# Patient Record
Sex: Male | Born: 2000 | Race: White | Hispanic: No | Marital: Single | Smoking: Current every day smoker
Health system: Southern US, Community
[De-identification: ages and names within clinical notes are randomized; demographics above are authoritative.]

## PROBLEM LIST (undated history)

## (undated) DIAGNOSIS — F329 Major depressive disorder, single episode, unspecified: Secondary | ICD-10-CM

## (undated) DIAGNOSIS — S40811A Abrasion of right upper arm, initial encounter: Secondary | ICD-10-CM

## (undated) DIAGNOSIS — F319 Bipolar disorder, unspecified: Secondary | ICD-10-CM

## (undated) DIAGNOSIS — S022XXA Fracture of nasal bones, initial encounter for closed fracture: Secondary | ICD-10-CM

## (undated) DIAGNOSIS — F32A Depression, unspecified: Secondary | ICD-10-CM

## (undated) DIAGNOSIS — E109 Type 1 diabetes mellitus without complications: Secondary | ICD-10-CM

## (undated) DIAGNOSIS — R569 Unspecified convulsions: Secondary | ICD-10-CM

## (undated) HISTORY — DX: Type 1 diabetes mellitus without complications: E10.9

## (undated) HISTORY — PX: APPENDECTOMY: SHX54

---
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
## 2018-02-16 ENCOUNTER — Emergency Department (HOSPITAL_COMMUNITY)
Admission: EM | Admit: 2018-02-16 | Discharge: 2018-02-16 | Disposition: A | Discharge status: Home / Self Care | Payer: Medicaid Other | Attending: Emergency Medicine | Admitting: Emergency Medicine

## 2018-02-16 ENCOUNTER — Encounter (HOSPITAL_COMMUNITY): Payer: Self-pay | Admitting: Emergency Medicine

## 2018-02-16 DIAGNOSIS — R739 Hyperglycemia, unspecified: Secondary | ICD-10-CM

## 2018-02-16 DIAGNOSIS — E1065 Type 1 diabetes mellitus with hyperglycemia: Secondary | ICD-10-CM | POA: Insufficient documentation

## 2018-02-16 DIAGNOSIS — F17210 Nicotine dependence, cigarettes, uncomplicated: Secondary | ICD-10-CM | POA: Insufficient documentation

## 2018-02-16 DIAGNOSIS — Z794 Long term (current) use of insulin: Secondary | ICD-10-CM | POA: Insufficient documentation

## 2018-02-16 DIAGNOSIS — IMO0001 Reserved for inherently not codable concepts without codable children: Secondary | ICD-10-CM

## 2018-02-16 LAB — URINALYSIS, ROUTINE W REFLEX MICROSCOPIC
BILIRUBIN URINE: NEGATIVE
Bacteria, UA: NONE SEEN
Glucose, UA: >=500 mg/dL — AB
HGB URINE DIPSTICK: NEGATIVE
Ketones, ur: 20 mg/dL — AB
Leukocytes, UA: NEGATIVE
Nitrite: NEGATIVE
PH: 5 (ref 5.0–8.0)
Protein, ur: NEGATIVE mg/dL
SPECIFIC GRAVITY, URINE: 1.025 (ref 1.005–1.030)

## 2018-02-16 LAB — BASIC METABOLIC PANEL
ANION GAP: 11 (ref 5–15)
BUN: 19 mg/dL (ref 6–20)
CO2: 25 mmol/L (ref 22–32)
Calcium: 9.2 mg/dL (ref 8.9–10.3)
Chloride: 92 mmol/L — ABNORMAL LOW (ref 98–111)
Creatinine, Ser: 1.03 mg/dL (ref 0.61–1.24)
GFR calc Af Amer: >60 mL/min (ref >60)
GLUCOSE: 775 mg/dL — AB (ref 70–99)
Potassium: 4.9 mmol/L (ref 3.5–5.1)
SODIUM: 128 mmol/L — AB (ref 135–145)

## 2018-02-16 LAB — CBC
HCT: 43.1 % (ref 39.0–52.0)
Hemoglobin: 14 g/dL (ref 13.0–17.0)
MCH: 28 pg (ref 26.0–34.0)
MCHC: 32.5 g/dL (ref 30.0–36.0)
MCV: 86.2 fL (ref 80.0–100.0)
PLATELETS: 267 10*3/uL (ref 150–400)
RBC: 5 MIL/uL (ref 4.22–5.81)
RDW: 12.5 % (ref 11.5–15.5)
WBC: 6.2 10*3/uL (ref 4.0–10.5)
nRBC: 0 % (ref 0.0–0.2)

## 2018-02-16 LAB — CBG MONITORING, ED
Glucose-Capillary: >600 mg/dL (ref 70–99)
Glucose-Capillary: >600 mg/dL (ref 70–99)
Glucose-Capillary: 417 mg/dL — ABNORMAL HIGH (ref 70–99)
Glucose-Capillary: 341 mg/dL — ABNORMAL HIGH (ref 70–99)

## 2018-02-16 MED ORDER — INSULIN PEN NEEDLE 32G X 4 MM MISC: 4 refills | Status: DC

## 2018-02-16 MED ORDER — INSULIN REGULAR(HUMAN) IN NACL 100-0.9 UT/100ML-% IV SOLN
INTRAVENOUS | Status: DC
  Administered 2018-02-16: 100 [IU] via INTRAVENOUS
  Filled 2018-02-16: qty 100

## 2018-02-16 MED ORDER — SODIUM CHLORIDE 0.9 % IV BOLUS
2000.0000 mL | Freq: Once | INTRAVENOUS | Status: AC
  Administered 2018-02-16: 2000 mL via INTRAVENOUS

## 2018-02-16 MED ORDER — INSULIN GLARGINE 100 UNIT/ML SOLOSTAR PEN: 25.0000 [IU] | PEN_INJECTOR | Freq: Every day | SUBCUTANEOUS | 3 refills | Status: DC

## 2018-02-16 MED ORDER — DEXTROSE 50 % IV SOLN: 25.0000 mL | INTRAVENOUS | Status: DC | PRN

## 2018-02-16 MED ORDER — INSULIN REGULAR BOLUS VIA INFUSION
0.0000 [IU] | Freq: Three times a day (TID) | INTRAVENOUS | Status: DC
  Filled 2018-02-16: qty 10

## 2018-02-16 MED ORDER — INSULIN ASPART 100 UNIT/ML FLEXPEN: PEN_INJECTOR | SUBCUTANEOUS | 3 refills | Status: DC

## 2018-02-16 MED ORDER — DEXTROSE-NACL 5-0.45 % IV SOLN: INTRAVENOUS | Status: DC

## 2018-02-16 MED ORDER — SODIUM CHLORIDE 0.9 % IV SOLN
INTRAVENOUS | Status: DC
  Administered 2018-02-16: 14:00:00 via INTRAVENOUS

## 2018-02-16 NOTE — ED Notes (Signed)
Pt requesting d/c. MD aware.

## 2018-02-16 NOTE — ED Provider Notes (Addendum)
[facility] DEPT Provider Note   CSN: [id number] Arrival date & time: 02/16/18  1245     History   Chief Complaint Chief Complaint  Patient presents with  . Hyperglycemia    HPI Rodney [name] is a 17 y.o. male.  HPI 17 year old type I diabetic presents the emergency department with hyperglycemia.  He reports that he did not take his NovoLog this morning.  He is otherwise compliant with his medications.  He still has his insulin at home.  He presents feeling somewhat lightheaded while at work without associated syncope.  No preceding chest pain or palpitations.  Feels better after IV fluids at this time.  Found to be hyperglycemic on arrival to the emergency department.  Initially had some nausea without vomiting.  Denies nausea at this time.  Denies headache.   Past Medical History:  Diagnosis Date  . Abrasion of arm, right 08/22/2011  . Bipolar 1 disorder ([place])   . Depression   . Diabetes mellitus    Type I - insulin pump  . Diabetes mellitus type I ([place])   . Nasal fracture 08/13/2011   was hit in nose while playing basketball  . Seizures ([place])    x 3 - due to low blood sugar - last time 5-6 yrs. ago    Patient Active Problem List   Diagnosis Date Noted  . 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 GERD (gastroesophageal reflux disease) 09/15/2015  . Unintentional weight loss 09/15/2015  . Injury of right hand 07/28/2015  . Diabetic ketoacidosis without coma associated with type 1 diabetes mellitus ([place])   . Ketonuria 04/17/2014  . Poorly controlled type 1 diabetes mellitus ([place]) 04/17/2014  . Hyperglycemia due to type 1 diabetes mellitus ([place]) 03/18/2014  . Diabetic ketoacidosis ([place]) 05/20/2013  . Dehydration 05/06/2013  . Non compliance with medical treatment 05/06/2013  . DKA (diabetic ketoacidoses) ([place]) 05/05/2013  . Diabetes ([place]) 03/25/2013  . Mild Diabetic ketoacidosis  03/25/2013  . Nausea with vomiting 03/25/2013  . Adjustment reaction 12/05/2012  . DKA (diabetic ketoacidosis) ([place]) 12/04/2012  . DKA, type 1 ([place]) 12/04/2012  . Non compliance w medication regimen 11/21/2012  . 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[place]) 08/02/2012  . 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 Fracture, nasal 08/24/2011    Class: Acute  . Goiter   . Hypoglycemia associated with diabetes ([place]) 05/23/2011  . 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 Diabetic autonomic neuropathy ([place]) 05/23/2011  . Tachycardia 05/23/2011  . Type I (juvenile type) diabetes mellitus without mention of complication, uncontrolled 08/30/2010  . 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 APPENDECTOMY    . CLOSED REDUCTION NASAL FRACTURE  08/24/2011   Procedure: CLOSED REDUCTION NASAL FRACTURE;  Surgeon: [name] Belfast, MD;  Location: [facility];  Service: ENT;  Laterality: N/A;        Home Medications    Prior to Admission medications   Medication Sig Start Date End Date Taking? Authorizing Provider  DEPAKOTE 250 MG DR tablet Take two tablets each evening. Patient taking differently: Take 500 mg by mouth every evening.  09/10/15 09/09/16  Sherrlyn Hock, MD  glucagon (GLUCAGON EMERGENCY) 1 MG injection Inject 1 mg into the vein once as needed (for hypoglycemia). 04/19/14   Guerry Minors, MD  insulin aspart (NOVOLOG FLEXPEN) 100 UNIT/ML FlexPen By two-component method at meals, bedtime  and 2 Am. Up to 50 units per day. 09/23/16 12/13/17  Sherrlyn Hock, MD  insulin aspart (NOVOLOG FLEXPEN) 100 UNIT/ML FlexPen Sliding Scale Inject into the skin 3 (three) times daily with meals. Sliding Scale 02/16/18   Jola Schmidt, MD  insulin aspart protamine- aspart (NOVOLOG MIX 70/30) (70-30) 100 UNIT/ML injection Inject 15 mLs (1,500 Units total) into the skin 2 (two)  times daily with a meal. Issue one 5-pack of pens per month. Take 30 units at the first meal of the day and 15 units at dinner. Patient not taking: Reported on 09/23/2016 09/13/16 09/13/17  Long, Wonda Olds, MD  Insulin Glargine (LANTUS SOLOSTAR) 100 UNIT/ML Solostar Pen Inject 25 Units into the skin at bedtime. 02/16/18   Jola Schmidt, MD  Insulin Pen Needle (BD PEN NEEDLE NANO U/F) 32G X 4 MM MISC INJECT INSULIN VIA INSULIN PEN 7 X DAILY 02/16/18   Jola Schmidt, MD  NOVOLOG FLEXPEN 100 UNIT/ML FlexPen Inject at meals and bedtime, up to 50 units per day. 09/23/16 09/23/17  Sherrlyn Hock, MD    Family History Family History  Problem Relation Age of Onset  . Heart disease Mother        MI at age 63  . Diabetes Paternal Grandfather        type 2  . Hypertension Paternal Grandfather   . Heart disease Paternal Grandfather   . Hepatitis C Brother        older brother (incarcerated)    Social History Social History   Tobacco Use  . Smoking status: Current Every Day Smoker    Types: Cigarettes  . Smokeless tobacco: Never Used  Substance Use Topics  . Alcohol use: No  . Drug use: No     Allergies   Patient has no known allergies.   Review of Systems Review of Systems  All other systems reviewed and are negative.    Physical Exam Updated Vital Signs BP 124/74   Pulse 65   Temp (!) 97.5 F (36.4 C) (Oral)   Resp 15   Ht 5\' 6"  (1.676 m)   Wt 63 kg   SpO2 99%   BMI 22.44 kg/m   Physical Exam  Constitutional: He is oriented to person, place, and time. He appears well-developed and well-nourished.  HENT:  Head: Normocephalic and atraumatic.  Eyes: EOM are normal.  Neck: Normal range of motion.  Cardiovascular: Normal rate, regular rhythm, normal heart sounds and intact distal pulses.  Pulmonary/Chest: Effort normal and breath sounds normal. No respiratory distress.  Abdominal: Soft. He exhibits no distension. There is no tenderness.  Musculoskeletal: Normal range of  motion.  Neurological: He is alert and oriented to person, place, and time.  Skin: Skin is warm and dry.  Psychiatric: He has a normal mood and affect. Judgment normal.  Nursing note and vitals reviewed.    ED Treatments / Results  Labs (all labs ordered are listed, but only abnormal results are displayed) Labs Reviewed  BASIC METABOLIC PANEL - Abnormal; Notable for the following components:      Result Value   Sodium 128 (*)    Chloride 92 (*)    Glucose, Bld 775 (*)    All other components within normal limits  URINALYSIS, ROUTINE W REFLEX MICROSCOPIC - Abnormal; Notable for the following components:   Color, Urine COLORLESS (*)    Glucose, UA >=500 (*)    Ketones, ur 20 (*)    All other components within normal limits  CBG  MONITORING, ED - Abnormal; Notable for the following components:   Glucose-Capillary >600 (*)    All other components within normal limits  CBG MONITORING, ED - Abnormal; Notable for the following components:   Glucose-Capillary >600 (*)    All other components within normal limits  CBG MONITORING, ED - Abnormal; Notable for the following components:   Glucose-Capillary 417 (*)    All other components within normal limits  CBG MONITORING, ED - Abnormal; Notable for the following components:   Glucose-Capillary 341 (*)    All other components within normal limits  CBC  HEMOGLOBIN A1C    EKG None  Radiology No results found.  Procedures .Critical Care Performed by: Jola Schmidt, MD Authorized by: Jola Schmidt, MD     CRITICAL CARE Performed by: Jola Schmidt Total critical care time: 31 minutes Critical care time was exclusive of separately billable procedures and treating other patients. Critical care was necessary to treat or prevent imminent or life-threatening deterioration. Critical care was time spent personally by me on the following activities: development of treatment plan with patient and/or surrogate as well as nursing,  discussions with consultants, evaluation of patient's response to treatment, examination of patient, obtaining history from patient or surrogate, ordering and performing treatments and interventions, ordering and review of laboratory studies, ordering and review of radiographic studies, pulse oximetry and re-evaluation of patient's condition.  Medications Ordered in ED Medications  dextrose 5 %-0.45 % sodium chloride infusion ( Intravenous Hold 02/16/18 1355)  insulin regular bolus via infusion 0-10 Units (has no administration in time range)  insulin regular, human (MYXREDLIN) 100 units/ 100 mL infusion ( Intravenous Rate/Dose Verify 02/16/18 1547)  dextrose 50 % solution 25 mL (has no administration in time range)  0.9 %  sodium chloride infusion ( Intravenous Stopped 02/16/18 1357)  sodium chloride 0.9 % bolus 2,000 mL ( Intravenous Rate/Dose Verify 02/16/18 1547)     Initial Impression / Assessment and Plan / ED Course  I have reviewed the triage vital signs and the nursing notes.  Pertinent labs & imaging results that were available during my care of the patient were reviewed by me and considered in my medical decision making (see chart for details).    Patient feels better after IV fluids and IV insulin.  His blood sugar is improving.  He is not acidotic.  Anion gap is normal.  Hyperglycemia medication noncompliance.  Patient discharged home with new prescriptions for his insulin.  He is encouraged to follow-up with his primary care physician.  Department for new or worsening symptoms.  IV hydration here in the ER.   Final Clinical Impressions(s) / ED Diagnoses   Final diagnoses:  Hyperglycemia    ED Discharge Orders         Ordered    Insulin Pen Needle (BD PEN NEEDLE NANO U/F) 32G X 4 MM MISC     02/16/18 1621    insulin aspart (NOVOLOG FLEXPEN) 100 UNIT/ML FlexPen  Status:  Discontinued     02/16/18 1621    Insulin Glargine (LANTUS SOLOSTAR) 100 UNIT/ML Solostar Pen  Daily  at bedtime,   Status:  Discontinued     02/16/18 1621    insulin aspart (NOVOLOG FLEXPEN) 100 UNIT/ML FlexPen     02/16/18 1623    Insulin Glargine (LANTUS SOLOSTAR) 100 UNIT/ML Solostar Pen  Daily at bedtime,   Status:  Discontinued     02/16/18 1623    Insulin Glargine (LANTUS SOLOSTAR) 100 UNIT/ML Solostar Pen  Daily at  bedtime     02/16/18 1624           Jola Schmidt, MD 02/16/18 1631    Jola Schmidt, MD 03/05/18 [phone]

## 2018-02-16 NOTE — ED Notes (Signed)
Date and time results received: 02/16/18 1:40 PM  (use smartphrase ".now" to insert current time)  Test: Glucose Critical Value: 775  Name of Provider Notified: Megan RN   Orders Received? Or Actions Taken?:

## 2018-02-16 NOTE — ED Triage Notes (Signed)
Pt reports that is type 1 DM and today at work sugar read high. hasnt had insulin today. Reports feels nauseated and dizzy with headache

## 2018-02-20 ENCOUNTER — Ambulatory Visit (INDEPENDENT_AMBULATORY_CARE_PROVIDER_SITE_OTHER): Payer: Medicaid Other | Admitting: "Endocrinology

## 2018-02-20 ENCOUNTER — Encounter (INDEPENDENT_AMBULATORY_CARE_PROVIDER_SITE_OTHER): Payer: Self-pay | Admitting: "Endocrinology

## 2018-02-20 VITALS — BP 118/76 | HR 90 | Ht 66.77 in | Wt 140.6 lb

## 2018-02-20 DIAGNOSIS — E049 Nontoxic goiter, unspecified: Secondary | ICD-10-CM

## 2018-02-20 DIAGNOSIS — IMO0001 Reserved for inherently not codable concepts without codable children: Secondary | ICD-10-CM

## 2018-02-20 DIAGNOSIS — E1065 Type 1 diabetes mellitus with hyperglycemia: Secondary | ICD-10-CM

## 2018-02-20 DIAGNOSIS — Z23 Encounter for immunization: Secondary | ICD-10-CM

## 2018-02-20 DIAGNOSIS — K219 Gastro-esophageal reflux disease without esophagitis: Secondary | ICD-10-CM

## 2018-02-20 DIAGNOSIS — R1013 Epigastric pain: Secondary | ICD-10-CM

## 2018-02-20 DIAGNOSIS — E1010 Type 1 diabetes mellitus with ketoacidosis without coma: Secondary | ICD-10-CM | POA: Diagnosis not present

## 2018-02-20 DIAGNOSIS — K3184 Gastroparesis: Secondary | ICD-10-CM

## 2018-02-20 LAB — POCT URINALYSIS DIPSTICK: Glucose, UA: POSITIVE — AB

## 2018-02-20 LAB — POCT GLUCOSE (DEVICE FOR HOME USE): GLUCOSE FASTING, POC: 555 mg/dL — AB (ref 70–99)

## 2018-02-20 LAB — POCT GLYCOSYLATED HEMOGLOBIN (HGB A1C): Hemoglobin A1C: 10.4 % — AB (ref 4.0–5.6)

## 2018-02-20 MED ORDER — ACCU-CHEK FASTCLIX LANCETS MISC: 3 refills | Status: AC

## 2018-02-20 MED ORDER — ACCU-CHEK GUIDE VI STRP: ORAL_STRIP | 6 refills | Status: AC

## 2018-02-20 MED ORDER — OMEPRAZOLE 40 MG PO CPDR: DELAYED_RELEASE_CAPSULE | ORAL | 6 refills | Status: DC

## 2018-02-20 MED ORDER — GLUCAGON (RDNA) 1 MG IJ KIT: 1.0000 mg | PACK | Freq: Once | INTRAMUSCULAR | 12 refills | Status: DC | PRN

## 2018-02-20 MED ORDER — INSULIN ASPART 100 UNIT/ML FLEXPEN: PEN_INJECTOR | SUBCUTANEOUS | 3 refills | Status: DC

## 2018-02-20 NOTE — Progress Notes (Signed)
Subjective:  Patient Name: Rodney [name] Date of Birth: [birth year]  MRN: [id number]  Rodney [name]  presents to the office today for follow-up evaluation and management of his type 1 diabetes, hypoglycemia, goiter, growth delay, autonomic neuropathy, tachycardia, and non-compliance with medical treatment.  HISTORY OF PRESENT ILLNESS:   Rodney [name] is a 17 y.o. Caucasian young man.   Rodney [name] was unaccompanied.   53. Rodney [name] was almost 32 1/19 years old when he was diagnosed with T1DM in June 2005. He was admitted to [facility] and then followed at the Pediatric Diabetes Clinic at [facility]. We have followed him here since 02/24/05. He was then using Lantus as a basal insulin and was using Humalog lispro according to a sliding scale regimen. We arranged for additional DM education, to include carb counting, and converted him to a two-component plan for Humalog lispro in which he took both correction doses and food doses at meals, but also used a mini-sliding scale at HS and 2:00 AM. Unfortunately, as Rodney [name] and his parents were getting accustomed to his new insulin regimen, his mother tragically died suddenly of a massive MI in March 2007. Rodney [name], his fraternal twin, and his dad all took her death very hard. It took them months to recover, but dad did a wonderful job of working with [name]. In February 2011 we converted Rodney [name] to a Medtronic Paradigm Insulin pump. Although his initial HbA1c was 7.6% two months later, the A1c subsequently rose to 10.2% in January 2012 and to 12.2% in January 2014. At the time of his visit on 02/24/14, Rodney [name]'s only known microvascular complication of DM was his autonomic neuropathy with fixed tachycardia. As we discussed with Rodney [name] and his dad, these complications were entirely reversible if we could get his DM under better control. Unfortunately, Rodney [name] was admitted to [facility] for DKA due to non-compliance In January 2015 and was taken off his pump at that time. He was also readmitted  for DKA in  December 2015.   2. In 2014 and again in 2015 Rodney [name] was incarcerated. Neither Rodney [name] nor his father were willing to discuss the criminal activities that resulted in the incarcerations. Rodney [name] was released from incarceration in early 2017, but remained on probation. While he was incarcerated he was diagnosed with bipolar disorder and treated with Depakote, Abilify, and citalopram. He was subsequently re-incarcerated after his visit in May 2018. He was released from prison in early September 2019.   3. The patient's last [facility] visit was on 09/23/16.   A. He has been fairly healthy.   B. He now takes 25 units of Lantus insulin. He also takes Novolog according to his old 150/30/10 plan, but left his chart at home.    C. He went to the [name] ED on 02/16/18 for hyperglycemia. He had forgotten to take his Lantus the night before. His serum glucose was 775.  Urine glucose was >500 and ketones were 20.   D. He no longer takes Depakote or any other psychiatric medications.   E. He proudly showed me pictures of his baby daughter.     4. Pertinent Review of Systems:  Constitutional: The patient feels "good". His energy has been "up and down". He says that his depression and anxiety have been good.  Eyes: Vision seems to be fairly good without his glasses, but he has blurring at times. There are no other recognized eye problems. Last eye exam was in the Spring of 2015. There were no reported signs of diabetic eye disease. Marland Kitchen  Neck: The patient has no complaints of anterior neck swelling, soreness, tenderness, pressure, discomfort, or difficulty swallowing.  Heart: Heart rate increases with exercise or other physical activity. The patient has no complaints of palpitations, irregular heart beats, chest pain, or chest pressure.   Gastrointestinal: He does not have much of an appetite. He has lot of heartburn. He sometimes gets nausea and/or stomach pains spontaneous. Sometimes when he eats he also feels very  nauseous. He still has occasional constipation. There are no problems with post-prandial bloating or diarrhea.  Hands: His hands sometimes tense up when he has been working with them for long periods of time.  Legs: His ankles hurt occasionally at the sites of previous sprains. Muscle mass and strength seem normal. There are no other complaints of numbness, tingling, burning, or pain. No edema is noted.  Feet: He has no complaints of numbness, tingling, burning, or pain. No edema is noted. Neurologic: There are no recognized problems with muscle movement and strength, sensation, or coordination Hypoglycemia: He has not had any low BGs lately.    Skin: The previous rash on his legs persists.   GU: He still has morning erection. His libido and function are good.   4. BG meter: 7-day average 322, 14-day average 336, 30-day average 346; range 92-Hi, mostly in the 300s  PAST MEDICAL, FAMILY, AND SOCIAL HISTORY  Past Medical History:  Diagnosis Date  . Abrasion of arm, right 08/22/2011  . Bipolar 1 disorder ([place])   . Depression   . Diabetes mellitus    Type I - insulin pump  . Diabetes mellitus type I ([place])   . Nasal fracture 08/13/2011   was hit in nose while playing basketball  . Seizures ([place])    x 3 - due to low blood sugar - last time 5-6 yrs. ago    Family History  Problem Relation Age of Onset  . Heart disease Mother        MI at age 8  . Diabetes Paternal Grandfather        type 2  . Hypertension Paternal Grandfather   . Heart disease Paternal Grandfather   . Hepatitis C Brother        older brother (incarcerated)     Current Outpatient Medications:  .  glucagon (GLUCAGON EMERGENCY) 1 MG injection, Inject 1 mg into the vein once as needed (for hypoglycemia)., Disp: 1 each, Rfl: 12 .  insulin aspart (NOVOLOG FLEXPEN) 100 UNIT/ML FlexPen, Sliding Scale Inject into the skin 3 (three) times daily with meals. Sliding Scale, Disp: 15 mL, Rfl: 3 .  Insulin Glargine (LANTUS  SOLOSTAR) 100 UNIT/ML Solostar Pen, Inject 25 Units into the skin at bedtime., Disp: 7 pen, Rfl: 3 .  Insulin Pen Needle (BD PEN NEEDLE NANO U/F) 32G X 4 MM MISC, INJECT INSULIN VIA INSULIN PEN 7 X DAILY, Disp: 5 each, Rfl: 4 .  DEPAKOTE 250 MG DR tablet, Take two tablets each evening. (Patient taking differently: Take 500 mg by mouth every evening. ), Disp: 60 tablet, Rfl: 3  Allergies as of 02/20/2018  . (No Known Allergies)     reports that he has been smoking cigarettes. He has never used smokeless tobacco. He reports that he does not drink alcohol or use drugs. Pediatric History  Patient Guardian Status  . Father:  Matusevich,[name]   Other Topics Concern  . Not on file  Social History Narrative   Lives with dad and twin brother. Mom deceased 2005/08/10 from [place]. 07/23/22  grade at [employer]. [place] football. Pt has not smoked cigarettes or marijuana in the last 9 months.    1. School and family: He is now living with his father. He got out prison on 01/05/18 and is on parole. 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 He wants to go back to [employer] at some time in the future. He has a baby daughter whom he co-parents.     2. Activities: He works out a lot now, both strength and cardio.   3. Substances: He smokes and takes alcohol occasionally, but no illicit drugs.    4. Primary Care Provider: None   REVIEW OF SYSTEMS: There are no other significant problems involving Andree's other body systems.   Objective:  Vital Signs:  BP 118/76   Pulse 90   Ht 5' 6.77" (1.696 m)   Wt 140 lb 9.6 oz (63.8 kg)   BMI 22.17 kg/m    Ht Readings from Last 3 Encounters:  02/20/18 5' 6.77" (1.696 m) (16 %, Z= -1.01)*  02/16/18 5\' 6"  (1.676 m) (10 %, Z= -1.28)*  09/23/16 5' 7.17" (1.706 m) (21 %, Z= -0.80)*   * Growth percentiles are based on CDC (Boys, 2-20 Years) data.   Wt Readings from Last 3 Encounters:  02/20/18 140 lb 9.6 oz (63.8 kg) (26 %, Z= -0.64)*   02/16/18 139 lb (63 kg) (24 %, Z= -0.72)*  09/23/16 140 lb (63.5 kg) (33 %, Z= -0.44)*   * Growth percentiles are based on CDC (Boys, 2-20 Years) data.   HC Readings from Last 3 Encounters:  No data found for [facility]   Body surface area is 1.73 meters squared. 16 %ile (Z= -1.01) based on CDC (Boys, 2-20 Years) Stature-for-age data based on Stature recorded on 02/20/2018. 26 %ile (Z= -0.64) based on CDC (Boys, 2-20 Years) weight-for-age data using vitals from 02/20/2018.  PHYSICAL EXAM:  Constitutional: The patient appears healthy, but thinner. He is alert and oriented. His affect is still somewhat flat, but he became quite animated when talking about his baby daughter. His height has plateaued at the 21.16%. He has lost 4 pounds since his last visit. His weight has decreased to the 33.15%. His insight is good today. He was polite, engaged, and much more mature. Head: The head is normocephalic. Face: The face appears normal. There are no obvious dysmorphic features. Eyes: The eyes appear to be normally formed and spaced. Gaze is conjugate. There is no obvious arcus or proptosis. Eyes are somewhat dry.  Ears: The ears are normally placed and appear externally normal. Mouth: The oropharynx and tongue appear normal. Dentition appears to be normal for age. Mouth is moist. Neck: The neck appears to be visibly normal. The thyroid gland is more enlarged at about 21-22 grams in size. Both lobes are enlarged, with the left lobe being larger. The consistency of the thyroid gland is fairly full. The thyroid gland is tender to palpation bilaterally today, more intensely on the left.  Lungs: The lungs are clear to auscultation. Air movement is good. Heart: Heart rate and rhythm are regular. Heart sounds S1 and S2 are normal. I did not appreciate any pathologic cardiac murmurs. Abdomen: The abdomen is normal in size for the patient's age. Bowel sounds are normal. There is no obvious hepatomegaly, splenomegaly,  or other mass effect.  Arms: Muscle size and bulk are normal for age. Hands: His hand has healed. Legs: Muscles  appear normal for age. No edema is present. He has a papulo-vesicular rash on his anterior calves.  Feet: DP pulses are 1+. He has mild calluses of the medial aspects of his great toes Neurologic: Strength is normal for age in both the upper and lower extremities. Muscle tone is normal. Sensation to touch is normal in both legs, but decreased in the right heel.Marland Kitchen      LAB DATA:   Results for orders placed or performed in visit on 02/20/18 (from the past 504 hour(s))  POCT Glucose (Device for Home Use)   Collection Time: 02/20/18  1:40 PM  Result Value Ref Range   Glucose Fasting, POC 555 (A) 70 - 99 mg/dL   POC Glucose    Results for orders placed or performed during the hospital encounter of 02/16/18 (from the past 504 hour(s))  CBG monitoring, ED   Collection Time: 02/16/18 12:53 PM  Result Value Ref Range   Glucose-Capillary >600 (HH) 70 - 99 mg/dL  Basic metabolic panel   Collection Time: 02/16/18  1:04 PM  Result Value Ref Range   Sodium 128 (L) 135 - 145 mmol/L   Potassium 4.9 3.5 - 5.1 mmol/L   Chloride 92 (L) 98 - 111 mmol/L   CO2 25 22 - 32 mmol/L   Glucose, Bld 775 (HH) 70 - 99 mg/dL   BUN 19 6 - 20 mg/dL   Creatinine, Ser 1.03 0.61 - 1.24 mg/dL   Calcium 9.2 8.9 - 10.3 mg/dL   GFR calc non Af Amer >60 >60 mL/min   GFR calc Af Amer >60 >60 mL/min   Anion gap 11 5 - 15  CBC   Collection Time: 02/16/18  1:04 PM  Result Value Ref Range   WBC 6.2 4.0 - 10.5 K/uL   RBC 5.00 4.22 - 5.81 MIL/uL   Hemoglobin 14.0 13.0 - 17.0 g/dL   HCT 43.1 39.0 - 52.0 %   MCV 86.2 80.0 - 100.0 fL   MCH 28.0 26.0 - 34.0 pg   MCHC 32.5 30.0 - 36.0 g/dL   RDW 12.5 11.5 - 15.5 %   Platelets 267 150 - 400 K/uL   nRBC 0.0 0.0 - 0.2 %  Urinalysis, Routine w reflex microscopic   Collection Time: 02/16/18  2:05 PM  Result Value Ref Range   Color, Urine COLORLESS (A) YELLOW    APPearance CLEAR CLEAR   Specific Gravity, Urine 1.025 1.005 - 1.030   pH 5.0 5.0 - 8.0   Glucose, UA >=500 (A) NEGATIVE mg/dL   Hgb urine dipstick NEGATIVE NEGATIVE   Bilirubin Urine NEGATIVE NEGATIVE   Ketones, ur 20 (A) NEGATIVE mg/dL   Protein, ur NEGATIVE NEGATIVE mg/dL   Nitrite NEGATIVE NEGATIVE   Leukocytes, UA NEGATIVE NEGATIVE   WBC, UA 0-5 0 - 5 WBC/hpf   Bacteria, UA NONE SEEN NONE SEEN   Squamous Epithelial / LPF 0-5 0 - 5  CBG monitoring, ED   Collection Time: 02/16/18  2:05 PM  Result Value Ref Range   Glucose-Capillary >600 (HH) 70 - 99 mg/dL  CBG monitoring, ED   Collection Time: 02/16/18  3:15 PM  Result Value Ref Range   Glucose-Capillary 417 (H) 70 - 99 mg/dL  CBG monitoring, ED   Collection Time: 02/16/18  4:18 PM  Result Value Ref Range   Glucose-Capillary 341 (H) 70 - 99 mg/dL   Labs 02/20/18: HbA1c 10.4%, CBG 522; urine positive for glucose, ketones moderate  Labs 02/16/18: BMP: sodium 128, potassium 4.9,  chloride 92, CO2 25, glucose 775  Labs 09/23/16: HbA1c 9.4%  Labs 09/15/15: HbA1c 11.5%  Labs 09/08/15: BG 502, venous pH 7.358, glucose 556, serum CO2 23, urine glucose >1000, urine ketones 40  Labs 06/25/15: HbA1c 9.8%; TSH 1.83, free T4 1.2, free T3 3.3; cholesterol 136, triglycerides 119, HDL 48, LDL 64; urine microalbumin/creatinine ratio 3; CMP normal except for glucose 234   Labs 02/21/14: BMP was normal except glucose 191;   Labs 05/03/12: CMP was normal, except for a glucose of 327. TSH 0.955, free T3 3.6; cholesterol 149, triglycerides 135, HDL 44, LDL 78   Assessment and Plan:   ASSESSMENT:  1-3. Type 1 diabetes/hypoglycemia/non-compliance:   A. His BGs have been too high for some time. He has been following the Lantus-Novolog 120/30/10 plan. He appears to need more insulin, but it is difficult to know which insulin to increase and when.  B. He has not had any low BGs recently. 4-7. Autonomic  neuropathy/tachycardia/gastroparesis/GERD/dyspepsia: His inappropriate sinus tachycardia has improved. His gastroparesis, dyspepsia, and heartburn have all worsened. He needs to resume omeprazole.  8. Peripheral neuropathy: This problem is evident today.  9-10. Goiter/thyroiditis: The goiter is smaller and is not tender today. The process of waxing and waning of thyroid gland size and the episodic tenderness are c/w evolving Hashimoto's disease. He was euthyroid in February 2017. 11. Hypertension: His BP is still mildly elevated today for age.  12. Bipolar disorder: He is not taking any psych meds now.   PLAN:  1. Diagnostic: Reviewed BG meter. Obtain fasting surveillance labs. Call Dr. Tobe Sos on Friday evening.  2. Therapeutic: Continue Lantus/Novolog plan, but increase the Lantus dose to 28 units. Continue the current Novolog plan. Resume omeprazole, 40 mg/day. Bring in your meter for download on Friday in two weeks.  3. Patient education: Discussed prevention and treatment of  BGs, use of the two-component method, and further evaluation of his BGs and lab tests.                                                                              4. Follow-up: 1 month   Level of Service: This visit lasted in excess of 105 minutes. More than 50% of the visit was devoted to counseling.  Tillman Sers, MD, CDE Pediatric and Adult Endocrinology

## 2018-02-20 NOTE — Patient Instructions (Addendum)
Follow up visit on a Friday morning in 4-6 weeks. Call Dr. Tobe Sos Friday evening between 8:00-9:30 PM to discuss BGs.

## 2018-02-23 ENCOUNTER — Other Ambulatory Visit (INDEPENDENT_AMBULATORY_CARE_PROVIDER_SITE_OTHER): Payer: Self-pay | Admitting: *Deleted

## 2018-02-23 MED ORDER — INSULIN ASPART 100 UNIT/ML FLEXPEN: PEN_INJECTOR | SUBCUTANEOUS | 5 refills | Status: DC

## 2018-03-09 ENCOUNTER — Other Ambulatory Visit (INDEPENDENT_AMBULATORY_CARE_PROVIDER_SITE_OTHER): Payer: Medicaid Other | Admitting: *Deleted

## 2018-04-04 ENCOUNTER — Ambulatory Visit (INDEPENDENT_AMBULATORY_CARE_PROVIDER_SITE_OTHER): Payer: Medicaid Other | Admitting: "Endocrinology

## 2018-04-05 ENCOUNTER — Ambulatory Visit (INDEPENDENT_AMBULATORY_CARE_PROVIDER_SITE_OTHER): Payer: Medicaid Other | Admitting: "Endocrinology

## 2018-04-05 ENCOUNTER — Encounter (INDEPENDENT_AMBULATORY_CARE_PROVIDER_SITE_OTHER): Payer: Self-pay | Admitting: "Endocrinology

## 2018-04-05 VITALS — BP 124/58 | HR 84 | Ht 67.09 in | Wt 145.2 lb

## 2018-04-05 DIAGNOSIS — E10649 Type 1 diabetes mellitus with hypoglycemia without coma: Secondary | ICD-10-CM

## 2018-04-05 DIAGNOSIS — E1065 Type 1 diabetes mellitus with hyperglycemia: Secondary | ICD-10-CM | POA: Diagnosis not present

## 2018-04-05 DIAGNOSIS — I10 Essential (primary) hypertension: Secondary | ICD-10-CM

## 2018-04-05 DIAGNOSIS — Z91199 Patient's noncompliance with other medical treatment and regimen due to unspecified reason: Secondary | ICD-10-CM

## 2018-04-05 DIAGNOSIS — Z9119 Patient's noncompliance with other medical treatment and regimen: Secondary | ICD-10-CM

## 2018-04-05 DIAGNOSIS — E1043 Type 1 diabetes mellitus with diabetic autonomic (poly)neuropathy: Secondary | ICD-10-CM

## 2018-04-05 DIAGNOSIS — G609 Hereditary and idiopathic neuropathy, unspecified: Secondary | ICD-10-CM

## 2018-04-05 DIAGNOSIS — IMO0001 Reserved for inherently not codable concepts without codable children: Secondary | ICD-10-CM

## 2018-04-05 DIAGNOSIS — F3110 Bipolar disorder, current episode manic without psychotic features, unspecified: Secondary | ICD-10-CM

## 2018-04-05 LAB — POCT URINALYSIS DIPSTICK: GLUCOSE UA: POSITIVE — AB

## 2018-04-05 LAB — POCT GLUCOSE (DEVICE FOR HOME USE): Glucose Fasting, POC: 534 mg/dL — AB (ref 70–99)

## 2018-04-05 MED ORDER — GLUCOSE BLOOD VI STRP: ORAL_STRIP | 5 refills | Status: DC

## 2018-04-05 MED ORDER — INSULIN GLARGINE 100 UNIT/ML SOLOSTAR PEN: PEN_INJECTOR | SUBCUTANEOUS | 5 refills | Status: DC

## 2018-04-05 NOTE — Progress Notes (Addendum)
Subjective:  Patient Name: Rodney [name] Date of Birth: 06/18/2000  MRN: [id number]  Rodney [name]  presents to the office today for follow-up evaluation and management of his type 1 diabetes, hypoglycemia, goiter, growth delay, autonomic neuropathy, tachycardia, and non-compliance with medical treatment.  HISTORY OF PRESENT ILLNESS:   Rodney [name] is a 17 y.o. Caucasian young man.   [name] was unaccompanied.   79. [name] was almost 37 1/19 years old when he was diagnosed with T1DM in June 2005. He was admitted to [facility] and then followed at the Pediatric [facility] at [facility]. We have followed him here since 02/24/05. He was then using Lantus as a basal insulin and was using Humalog lispro according to a sliding scale regimen. We arranged for additional DM education, to include carb counting, and converted him to a two-component plan for Humalog lispro in which he took both correction doses and food doses at meals, but also used a mini-sliding scale at HS and 2:00 AM. Unfortunately, as General and his parents were getting accustomed to his new insulin regimen, his mother tragically died suddenly of a massive MI in March 2007. Rodney [name], his fraternal twin, and his dad all took her death very hard. It took them months to recover, but dad did a wonderful job of working with [name]. In February 2011 we converted Rodney [name] to a Medtronic Paradigm Insulin pump. Although his initial HbA1c was 7.6% two months later, the A1c subsequently rose to 10.2% in January 2012 and to 12.2% in January 2014. At the time of his visit on 02/24/14, Airam's only known microvascular complication of DM was his autonomic neuropathy with fixed tachycardia. As we discussed with Jodi and his dad, these complications were entirely reversible if we could get his DM under better control. Unfortunately, Sathvik was admitted to [facility] for DKA due to non-compliance In January 2015 and was taken off his pump at that time. He was also readmitted  for DKA in  December 2015.   2. In 2014 and again in 2015 Taiyo was incarcerated. Neither Tymeer nor his father were willing to discuss the criminal activities that resulted in the incarcerations. Gunnard was released from incarceration in early 2017, but remained on probation. While he was incarcerated he was diagnosed with bipolar disorder and treated with Depakote, Abilify, and citalopram. He was subsequently re-incarcerated after his visit in May 2018. He was released from prison in early September 2019.   3. The patient's last [facility] visit was on 02/20/18. At that visit I increased his Lantus dose to 28 units.  He was supposed to call me the following Friday evening, but did not. He has not tried to call me since then. He said that he forgets.  A. He had been fairly healthy until he developed a new URI several days ago. He has nasal congestion and cough. Marland Kitchen   B.He missed his follow up visit yesterday because he forgot about it. We called him to fit him in this morning. When he got here he informed me that he had to leave in 30 minutes in order to take a bus to work.    C. He now takes 28 units of Lantus insulin. He also takes Novolog according to his old 150/30/10 plan.  D. He no longer takes Depakote or any other psychiatric medications.     4. Pertinent Review of Systems:  Constitutional: The patient feels "sick". His energy has been worse when he is sick. He says that his depression and  anxiety have varied.     4. BG meter:He checked his BGs on 3 of the past 30 days. His BGs have averaged about 300, range 114-540. He says that when he does not check BGs at meals he takes a food dose. He says that he has ben pretty reliable about taking Lantus.   PAST MEDICAL, FAMILY, AND SOCIAL HISTORY  Past Medical History:  Diagnosis Date  . Abrasion of arm, right 08/22/2011  . Bipolar 1 disorder ([place])   . Depression   . Diabetes mellitus    Type I - insulin pump  . Diabetes mellitus type I ([place])   .  Nasal fracture 08/13/2011   was hit in nose while playing basketball  . Seizures ([place])    x 3 - due to low blood sugar - last time 5-6 yrs. ago    Family History  Problem Relation Age of Onset  . Heart disease Mother        MI at age 77  . Diabetes Paternal Grandfather        type 2  . Hypertension Paternal Grandfather   . Heart disease Paternal Grandfather   . Hepatitis C Brother        older brother (incarcerated)     Current Outpatient Medications:  .  ACCU-CHEK FASTCLIX LANCETS MISC, Check sugar 10 x daily, Disp: 306 each, Rfl: 3 .  DEPAKOTE 250 MG DR tablet, Take two tablets each evening. (Patient taking differently: Take 500 mg by mouth every evening. ), Disp: 60 tablet, Rfl: 3 .  glucagon (GLUCAGON EMERGENCY) 1 MG injection, Inject 1 mg into the vein once as needed (for hypoglycemia)., Disp: 1 each, Rfl: 12 .  glucose blood (ACCU-CHEK GUIDE) test strip, Use 6x daily to check glucose, Disp: 200 each, Rfl: 5 .  insulin aspart (NOVOLOG FLEXPEN) 100 UNIT/ML FlexPen, Use up to 50 units daily., Disp: 15 mL, Rfl: 5 .  Insulin Glargine (LANTUS SOLOSTAR) 100 UNIT/ML Solostar Pen, Inject up to 50 units daily, Disp: 15 mL, Rfl: 5 .  Insulin Pen Needle (BD PEN NEEDLE NANO U/F) 32G X 4 MM MISC, INJECT INSULIN VIA INSULIN PEN 7 X DAILY, Disp: 5 each, Rfl: 4 .  omeprazole (PRILOSEC) 40 MG capsule, Take one daily., Disp: 30 capsule, Rfl: 6  Allergies as of 04/05/2018  . (No Known Allergies)     reports that he has been smoking cigarettes. He has never used smokeless tobacco. He reports that he does not drink alcohol or use drugs. Pediatric History  Patient Guardian Status  . Father:  Rodney [name],Rodney [name]   Other Topics Concern  . Not on file  Social History Narrative   Lives with dad and twin brother. Mom deceased 19-Aug-2005 from [place]. 8th grade at [employer]. [employer] football. Pt has not smoked cigarettes or marijuana in the last 9 months.    1. School and family: He is now living with his father.  He got out prison on 01/05/18 and is on parole. 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 He wants to go back to [employer] at some time in the future. He has a baby daughter whom he co-parents.     2. Activities: He works out a lot now, both strength and cardio.   3. Substances: He smokes and takes alcohol occasionally, but no illicit drugs.    4. Primary Care Provider: None   REVIEW OF SYSTEMS: There are no other significant  problems involving Dmarco's other body systems.   Objective:  Vital Signs:  BP (!) 124/58   Pulse 84   Ht 5' 7.09" (1.704 m)   Wt 145 lb 3.2 oz (65.9 kg)   BMI 22.68 kg/m    Ht Readings from Last 3 Encounters:  04/05/18 5' 7.09" (1.704 m) (18 %, Z= -0.90)*  02/20/18 5' 6.77" (1.696 m) (16 %, Z= -1.01)*  02/16/18 5\' 6"  (1.676 m) (10 %, Z= -1.28)*   * Growth percentiles are based on CDC (Boys, 2-20 Years) data.   Wt Readings from Last 3 Encounters:  04/05/18 145 lb 3.2 oz (65.9 kg) (33 %, Z= -0.43)*  02/20/18 140 lb 9.6 oz (63.8 kg) (26 %, Z= -0.64)*  02/16/18 139 lb (63 kg) (24 %, Z= -0.72)*   * Growth percentiles are based on CDC (Boys, 2-20 Years) data.   HC Readings from Last 3 Encounters:  No data found for [facility]   Body surface area is 1.77 meters squared. 18 %ile (Z= -0.90) based on CDC (Boys, 2-20 Years) Stature-for-age data based on Stature recorded on 04/05/2018. 33 %ile (Z= -0.43) based on CDC (Boys, 2-20 Years) weight-for-age data using vitals from 04/05/2018.  PHYSICAL EXAM:  Constitutional: The patient appears healthy and well nourished. He sniffles and coughs several times. He is alert and oriented. His affect and insight are fairly normal today. He was concerned about being to get to work on time. His height has plateaued at the 18.47%. He has gained 5 pounds since his last visit. His weight has decreased to the 33.21%. Head: The head is normocephalic. Face: The face appears normal. There are no obvious  dysmorphic features. Eyes: The eyes appear to be normally formed and spaced. Gaze is conjugate. There is no obvious arcus or proptosis. Eyes are somewhat dry.  Ears: The ears are normally placed and appear externally normal. Mouth: The oropharynx and tongue appear normal. Dentition appears to be normal for age. Mouth is moist. Neck: Not examined.   Lungs: The lungs are clear to auscultation. Air movement is good. Heart: Heart rate and rhythm are regular. Heart sounds S1 and S2 are normal. I did not appreciate any pathologic cardiac murmurs. Abdomen: The abdomen is normal in size for the patient's age. Bowel sounds are normal. There is no obvious hepatomegaly, splenomegaly, or other mass effect.  Arms: Muscle size and bulk are normal for age. Hands: His hand has healed. He dose not have a tremor today.  Legs: Muscles appear normal for age. No edema is present.  Feet: Not examined Neurologic: Strength is normal for age in both the upper and lower extremities. Muscle tone is normal. Sensation to touch is normal in both legs.      LAB DATA:   Results for orders placed or performed in visit on 04/05/18 (from the past 504 hour(s))  POCT Glucose (Device for Home Use)   Collection Time: 04/05/18 10:32 AM  Result Value Ref Range   Glucose Fasting, POC 534 (A) 70 - 99 mg/dL   POC Glucose     Labs 04/05/18: CBG 534, urine glucose positive, urine ketones small  Labs 02/20/18: HbA1c 10.4%, CBG 522; urine positive for glucose, ketones moderate. He did not come in to have lab tests done as I had requested.   Labs 02/16/18: BMP: sodium 128, potassium 4.9, chloride 92, CO2 25, glucose 775  Labs 09/23/16: HbA1c 9.4%  Labs 09/15/15: HbA1c 11.5%  Labs 09/08/15: BG 502, venous pH 7.358, glucose 556, serum CO2  23, urine glucose >1000, urine ketones 40  Labs 06/25/15: HbA1c 9.8%; TSH 1.83, free T4 1.2, free T3 3.3; cholesterol 136, triglycerides 119, HDL 48, LDL 64; urine microalbumin/creatinine ratio 3;  CMP normal except for glucose 234   Labs 02/21/14: BMP was normal except glucose 191;   Labs 05/03/12: CMP was normal, except for a glucose of 327. TSH 0.955, free T3 3.6; cholesterol 149, triglycerides 135, HDL 44, LDL 78   Assessment and Plan:   ASSESSMENT:  1-3. Type 1 diabetes/hypoglycemia/non-compliance:   A. His BGs are much too high. He says that he is taking Lantus reliably. He admits that he misses many correction doses, but says that he does take most correction doses. When I see the "hectic" BG pattern that he has, I suspect that he misses more Lantus doses and Novolog doses than he recognizes. He has been following the Lantus-Novolog 120/30/10 plan. He appears to need more insulin. Since most of his BGs are >200 across the 24-hour period, we will increase his Lantus dose now.   B. He has not had any low BGs recently. 4-7. Autonomic neuropathy/tachycardia/gastroparesis/GERD/dyspepsia: His inappropriate sinus tachycardia has continued to improve. We did not address his gastroparesis, dyspepsia, and heartburn today.   8. Peripheral neuropathy: This problem is not evident today.  9-10. Goiter/thyroiditis: We did not address these problems today. He was euthyroid in February 2017. He was supposed to have had labs done prior to this visit, but has not done so  11. Hypertension: His BP is good today.   12. Bipolar disorder: He is not taking any psych meds now. He indicated that his depression and anxiety are still issues for him. 13. Ketonuria: He has a small amount of ketones, indicating that he needs to be more compliant with taking his insulin doses.   PLAN:  1. Diagnostic: Reviewed BG meter. Obtain fasting surveillance labs. Call Dr. Tobe Sos on Sunday evening.  2. Therapeutic: Increase the Lantus dose to 32 units. Continue the current Novolog plan. Resume omeprazole, 40 mg/day. Bring in your meter for download on Friday in two weeks.  3. Patient education: Discussed his BG pattern and  the need to take his insulins more consistently.            4. Follow-up: 1 month   Level of Service: This visit lasted 40 minutes. More than 50% of the visit was devoted to counseling.  Tillman Sers, MD, CDE Pediatric and Adult Endocrinology

## 2018-04-05 NOTE — Patient Instructions (Signed)
Follow up visit in one month. Please cal Dr. Tobe Sos on Sunday evening between 8:00-9:30 PM to discuss BGs. Please increase the Lantus dose to 32 units.

## 2018-05-18 ENCOUNTER — Ambulatory Visit (INDEPENDENT_AMBULATORY_CARE_PROVIDER_SITE_OTHER): Payer: Medicaid Other | Admitting: "Endocrinology

## 2018-05-28 ENCOUNTER — Other Ambulatory Visit (INDEPENDENT_AMBULATORY_CARE_PROVIDER_SITE_OTHER): Payer: Self-pay | Admitting: "Endocrinology

## 2018-05-28 DIAGNOSIS — IMO0001 Reserved for inherently not codable concepts without codable children: Secondary | ICD-10-CM

## 2018-05-28 DIAGNOSIS — E1065 Type 1 diabetes mellitus with hyperglycemia: Principal | ICD-10-CM

## 2018-06-13 ENCOUNTER — Encounter (HOSPITAL_BASED_OUTPATIENT_CLINIC_OR_DEPARTMENT_OTHER): Payer: Self-pay | Admitting: Emergency Medicine

## 2018-06-13 ENCOUNTER — Other Ambulatory Visit: Payer: Self-pay

## 2018-06-13 ENCOUNTER — Emergency Department (HOSPITAL_BASED_OUTPATIENT_CLINIC_OR_DEPARTMENT_OTHER): Payer: Medicaid Other

## 2018-06-13 ENCOUNTER — Emergency Department (HOSPITAL_BASED_OUTPATIENT_CLINIC_OR_DEPARTMENT_OTHER)
Admission: EM | Admit: 2018-06-13 | Discharge: 2018-06-13 | Disposition: A | Discharge status: Home / Self Care | Payer: Medicaid Other | Attending: Emergency Medicine | Admitting: Emergency Medicine

## 2018-06-13 DIAGNOSIS — R69 Illness, unspecified: Secondary | ICD-10-CM

## 2018-06-13 DIAGNOSIS — F17210 Nicotine dependence, cigarettes, uncomplicated: Secondary | ICD-10-CM | POA: Insufficient documentation

## 2018-06-13 DIAGNOSIS — Z79899 Other long term (current) drug therapy: Secondary | ICD-10-CM | POA: Insufficient documentation

## 2018-06-13 DIAGNOSIS — R05 Cough: Secondary | ICD-10-CM | POA: Diagnosis present

## 2018-06-13 DIAGNOSIS — B9689 Other specified bacterial agents as the cause of diseases classified elsewhere: Secondary | ICD-10-CM

## 2018-06-13 DIAGNOSIS — J019 Acute sinusitis, unspecified: Secondary | ICD-10-CM | POA: Insufficient documentation

## 2018-06-13 DIAGNOSIS — E1065 Type 1 diabetes mellitus with hyperglycemia: Secondary | ICD-10-CM | POA: Insufficient documentation

## 2018-06-13 DIAGNOSIS — Z794 Long term (current) use of insulin: Secondary | ICD-10-CM | POA: Insufficient documentation

## 2018-06-13 DIAGNOSIS — J111 Influenza due to unidentified influenza virus with other respiratory manifestations: Secondary | ICD-10-CM | POA: Insufficient documentation

## 2018-06-13 DIAGNOSIS — R739 Hyperglycemia, unspecified: Secondary | ICD-10-CM

## 2018-06-13 LAB — URINALYSIS, ROUTINE W REFLEX MICROSCOPIC
Bilirubin Urine: NEGATIVE
Glucose, UA: >=500 mg/dL — AB
Hgb urine dipstick: NEGATIVE
Ketones, ur: 15 mg/dL — AB
LEUKOCYTE UA: NEGATIVE
Nitrite: NEGATIVE
Protein, ur: NEGATIVE mg/dL
Specific Gravity, Urine: 1.02 (ref 1.005–1.030)
pH: 6 (ref 5.0–8.0)

## 2018-06-13 LAB — BASIC METABOLIC PANEL
Anion gap: 10 (ref 5–15)
BUN: 15 mg/dL (ref 6–20)
CO2: 26 mmol/L (ref 22–32)
Calcium: 9.4 mg/dL (ref 8.9–10.3)
Chloride: 92 mmol/L — ABNORMAL LOW (ref 98–111)
Creatinine, Ser: 0.99 mg/dL (ref 0.61–1.24)
GFR calc non Af Amer: >60 mL/min (ref >60)
Glucose, Bld: 254 mg/dL — ABNORMAL HIGH (ref 70–99)
Potassium: 4 mmol/L (ref 3.5–5.1)
Sodium: 128 mmol/L — ABNORMAL LOW (ref 135–145)

## 2018-06-13 LAB — CBC
HCT: 47.2 % (ref 39.0–52.0)
Hemoglobin: 15.6 g/dL (ref 13.0–17.0)
MCH: 27.7 pg (ref 26.0–34.0)
MCHC: 33.1 g/dL (ref 30.0–36.0)
MCV: 83.8 fL (ref 80.0–100.0)
NRBC: 0 % (ref 0.0–0.2)
Platelets: 256 10*3/uL (ref 150–400)
RBC: 5.63 MIL/uL (ref 4.22–5.81)
RDW: 13 % (ref 11.5–15.5)
WBC: 8.4 10*3/uL (ref 4.0–10.5)

## 2018-06-13 LAB — CBG MONITORING, ED: Glucose-Capillary: 287 mg/dL — ABNORMAL HIGH (ref 70–99)

## 2018-06-13 LAB — URINALYSIS, MICROSCOPIC (REFLEX): RBC / HPF: NONE SEEN RBC/hpf (ref 0–5)

## 2018-06-13 MED ORDER — OSELTAMIVIR PHOSPHATE 75 MG PO CAPS: 75.0000 mg | ORAL_CAPSULE | Freq: Two times a day (BID) | ORAL | 0 refills | Status: DC

## 2018-06-13 MED ORDER — ACETAMINOPHEN 325 MG PO TABS
650.0000 mg | ORAL_TABLET | Freq: Once | ORAL | Status: AC
  Administered 2018-06-13: 650 mg via ORAL
  Filled 2018-06-13: qty 2

## 2018-06-13 MED ORDER — AMOXICILLIN-POT CLAVULANATE 875-125 MG PO TABS: 1.0000 | ORAL_TABLET | Freq: Two times a day (BID) | ORAL | 0 refills | Status: DC

## 2018-06-13 MED ORDER — SODIUM CHLORIDE 0.9 % IV BOLUS
1000.0000 mL | Freq: Once | INTRAVENOUS | Status: AC
  Administered 2018-06-13: 1000 mL via INTRAVENOUS

## 2018-06-13 NOTE — ED Provider Notes (Signed)
[facility] EMERGENCY DEPARTMENT Provider Note   CSN: [id number] Arrival date & time: 06/13/18  2009     History   Chief Complaint Chief Complaint  Patient presents with  . Cough  . Sore Throat  . Otalgia    HPI Rodney [name] is a 18 y.o. male with a hx of tobacco abuse, bipolar 1 disorder, T1DM, and seizures who presents to the emergency department with flulike symptoms over the past 2 to 3 days.  He states he was initially having some URI symptoms earlier last week but these seem to improve and now seem to acutely worsen again.  He reports congestion, rhinorrhea, left ear pain, sinus pressure, and cough that is dry.  States he had some chills and generalized achiness.  No specific alleviating or aggravating factors.  He has tried DayQuil without relief.  His brother is sick with similar symptoms.  Denies vomiting, diarrhea, chest pain, or dyspnea.  Last CBG at home was 325.  HPI  Past Medical History:  Diagnosis Date  . Abrasion of arm, right 08/22/2011  . Bipolar 1 disorder ([place])   . Depression   . Diabetes mellitus    Type I - insulin pump  . Diabetes mellitus type I ([place])   . Nasal fracture 08/13/2011   was hit in nose while playing basketball  . Seizures ([place])    x 3 - due to low blood sugar - last time 5-6 yrs. ago    Patient Active Problem List   Diagnosis Date Noted  . 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 GERD (gastroesophageal reflux disease) 09/15/2015  . Unintentional weight loss 09/15/2015  . Injury of right hand 07/28/2015  . Diabetic ketoacidosis without coma associated with type 1 diabetes mellitus ([place])   . Ketonuria 04/17/2014  . Poorly controlled type 1 diabetes mellitus ([place]) 04/17/2014  . Hyperglycemia due to type 1 diabetes mellitus ([place]) 03/18/2014  . Diabetic ketoacidosis ([place]) 05/20/2013  . Dehydration 05/06/2013  . Non compliance with medical treatment 05/06/2013  . DKA  (diabetic ketoacidoses) ([place]) 05/05/2013  . Diabetes ([place]) 03/25/2013  . Mild Diabetic ketoacidosis 03/25/2013  . Nausea with vomiting 03/25/2013  . Adjustment reaction 12/05/2012  . DKA (diabetic ketoacidosis) ([place]) 12/04/2012  . DKA, type 1 ([place]) 12/04/2012  . Non compliance w medication regimen 11/21/2012  . 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[place]) 08/02/2012  . 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 Fracture, nasal 08/24/2011    Class: Acute  . Goiter   . Hypoglycemia associated with diabetes ([place]) 05/23/2011  . 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 Diabetic autonomic neuropathy ([place]) 05/23/2011  . Tachycardia 05/23/2011  . Type I (juvenile type) diabetes mellitus without mention of complication, uncontrolled 08/30/2010  . 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 APPENDECTOMY    . CLOSED REDUCTION NASAL FRACTURE  08/24/2011   Procedure: CLOSED REDUCTION NASAL FRACTURE;  Surgeon: [name] Belfast, MD;  Location: [facility];  Service: ENT;  Laterality: N/A;        Home Medications    Prior to Admission medications   Medication Sig Start Date End Date Taking? Authorizing Provider  ACCU-CHEK FASTCLIX LANCETS MISC Check sugar 10 x daily 02/20/18 02/21/19  Sherrlyn Hock, MD  DEPAKOTE 250 MG DR tablet Take two tablets each evening. Patient taking differently: Take 500 mg by mouth every evening.  09/10/15  09/09/16  Sherrlyn Hock, MD  glucagon (GLUCAGON EMERGENCY) 1 MG injection Inject 1 mg into the vein once as needed (for hypoglycemia). 02/20/18   Sherrlyn Hock, MD  glucose blood (ACCU-CHEK GUIDE) test strip Use 6x daily to check glucose 04/05/18   Sherrlyn Hock, MD  insulin aspart (NOVOLOG FLEXPEN) 100 UNIT/ML FlexPen Use up to 50 units daily. 02/23/18   Sherrlyn Hock, MD  Insulin Glargine (LANTUS SOLOSTAR) 100 UNIT/ML  Solostar Pen Inject up to 50 units daily 04/05/18   Sherrlyn Hock, MD  Insulin Pen Needle (BD PEN NEEDLE NANO U/F) 32G X 4 MM MISC USE TO INJECT INSULIN VIA INSULIN PEN 7 TIME A DAY 05/28/18   Sherrlyn Hock, MD  omeprazole (PRILOSEC) 40 MG capsule Take one daily. 02/20/18 02/21/19  Sherrlyn Hock, MD    Family History Family History  Problem Relation Age of Onset  . Heart disease Mother        MI at age 60  . Diabetes Paternal Grandfather        type 2  . Hypertension Paternal Grandfather   . Heart disease Paternal Grandfather   . Hepatitis C Brother        older brother (incarcerated)    Social History Social History   Tobacco Use  . Smoking status: Current Every Day Smoker    Types: Cigarettes  . Smokeless tobacco: Never Used  Substance Use Topics  . Alcohol use: Yes    Comment: social  . Drug use: No     Allergies   Patient has no known allergies.   Review of Systems Review of Systems  Constitutional: Positive for chills. Negative for fever.  HENT: Positive for congestion, ear pain, rhinorrhea, sinus pressure and sore throat.   Respiratory: Positive for cough. Negative for shortness of breath.   Cardiovascular: Negative for chest pain.  Gastrointestinal: Negative for abdominal pain, blood in stool, diarrhea and vomiting.  Musculoskeletal: Positive for myalgias.  All other systems reviewed and are negative.    Physical Exam Updated Vital Signs BP 139/84 (BP Location: Right Arm)   Pulse (!) 119   Temp 99.4 F (37.4 C) (Oral)   Resp 20   Ht 5\' 6"  (1.676 m)   Wt 60.8 kg   SpO2 97%   BMI 21.63 kg/m   Physical Exam Vitals signs and nursing note reviewed.  Constitutional:      General: He is not in acute distress.    Appearance: He is well-developed. He is not toxic-appearing.  HENT:     Head: Normocephalic and atraumatic.     Right Ear: Tympanic membrane, ear canal and external ear normal. Tympanic membrane is not erythematous, retracted or  bulging.     Left Ear: Tympanic membrane, ear canal and external ear normal. Tympanic membrane is not erythematous, retracted or bulging.     Ears:     Comments: No mastoid erythema/warmth/swelling/tenderness.     Nose: Mucosal edema and congestion present.     Left Sinus: Maxillary sinus tenderness present.     Mouth/Throat:     Mouth: Mucous membranes are moist.     Pharynx: No pharyngeal swelling or oropharyngeal exudate.     Comments: Posterior oropharynx is symmetric appearing. Patient tolerating own secretions without difficulty. No trismus. No drooling. No hot potato voice. No swelling beneath the tongue, submandibular compartment is soft.  Eyes:     General:        Right eye: No discharge.  Left eye: No discharge.     Conjunctiva/sclera: Conjunctivae normal.  Neck:     Musculoskeletal: Neck supple. No edema, erythema, neck rigidity or crepitus.  Cardiovascular:     Rate and Rhythm: Regular rhythm. Tachycardia present.  Pulmonary:     Effort: Pulmonary effort is normal. No respiratory distress.     Breath sounds: Normal breath sounds. No wheezing, rhonchi or rales.  Abdominal:     General: There is no distension.     Palpations: Abdomen is soft.     Tenderness: There is no abdominal tenderness.  Lymphadenopathy:     Cervical: No cervical adenopathy.  Skin:    General: Skin is warm and dry.     Findings: No rash.  Neurological:     Mental Status: He is alert.     Comments: Clear speech.   Psychiatric:        Behavior: Behavior normal.     ED Treatments / Results  Labs (all labs ordered are listed, but only abnormal results are displayed) Labs Reviewed  URINALYSIS, ROUTINE W REFLEX MICROSCOPIC - Abnormal; Notable for the following components:      Result Value   Glucose, UA >=500 (*)    Ketones, ur 15 (*)    All other components within normal limits  URINALYSIS, MICROSCOPIC (REFLEX) - Abnormal; Notable for the following components:   Bacteria, UA RARE (*)      All other components within normal limits  CBG MONITORING, ED - Abnormal; Notable for the following components:   Glucose-Capillary 287 (*)    All other components within normal limits  CBC  BASIC METABOLIC PANEL    EKG None  Radiology Dg Chest 2 View  Result Date: 06/13/2018 CLINICAL DATA:  Cough EXAM: CHEST - 2 VIEW COMPARISON:  12/16/2009 FINDINGS: Mild peribronchial thickening. Heart and mediastinal contours are within normal limits. No focal opacities or effusions. No acute bony abnormality. IMPRESSION: Mild bronchitic changes. Electronically Signed   By: Rolm Baptise M.D.   On: 06/13/2018 21:57    Procedures Procedures (including critical care time)  Medications Ordered in ED Medications  sodium chloride 0.9 % bolus 1,000 mL (1,000 mLs Intravenous New Bag/Given 06/13/18 2221)  acetaminophen (TYLENOL) tablet 650 mg (650 mg Oral Given 06/13/18 2122)     Initial Impression / Assessment and Plan / ED Course  I have reviewed the triage vital signs and the nursing notes.  Pertinent labs & imaging results that were available during my care of the patient were reviewed by me and considered in my medical decision making (see chart for details).   Patient presents to the ED with URI/flu like sxs. Nontoxic appearing, in no apparent distress, initially tachycardic- improved throughout ER stay. Lungs CTA, CXR negative for infiltrate- doubt pneumonia. TM clear- not consistent with AOM, additionally no findings of AOE or mastoiditis. Centor 0- doubt strep. No meningismus. Given sxs started to improve then seemed to worsen w/ sinus tenderness feel that tx for possible bacterial sinusitis w/ Augmentin is reasonable. Patient also w/ somewhat flu like sxs, w/ hx of T1DM will also cover w/ Tamiflu as brother is sick with flu like sxs as well and patient would like this prescription. He was noted to be hyperglycemic at 287- basic labs & fluids subsequently obtained/administered. No  leukocytosis/anemia. Urinalysis with mild ketonuria and notable glucosuria, BMP without acidosis or elevation in anion gap to suggest DKA. Patient is hyponatremic/hypochloremic consistent with baseline labs on review also secondary to hyperglycemia, given NS fluids in  the ER. Will discharge home with tamiflu & augmentin, PCP follow up. I discussed results, treatment plan, need for follow-up, and return precautions with the patient. Provided opportunity for questions, patient confirmed understanding and is in agreement with plan.   Findings and plan of care discussed with supervising physician Dr. Laverta [place] - in agreement.   Vitals:   06/13/18 2019 06/13/18 2226  BP: 139/84 119/69  Pulse: (!) 119 94  Resp: 20 16  Temp: 99.4 F (37.4 C)   SpO2: 97% 99%    Final Clinical Impressions(s) / ED Diagnoses   Final diagnoses:  Acute bacterial sinusitis  Hyperglycemia  Influenza-like illness    ED Discharge Orders         Ordered    amoxicillin-clavulanate (AUGMENTIN) 875-125 MG tablet  Every 12 hours     06/13/18 2316    oseltamivir (TAMIFLU) 75 MG capsule  Every 12 hours     06/13/18 2316           Audrina Marten, [place] R, PA-C 06/13/18 2319    Margette Fast, MD 06/14/18 1037

## 2018-06-13 NOTE — ED Notes (Signed)
Patient transported to X-ray 

## 2018-06-13 NOTE — ED Notes (Signed)
Pt verbalizes understanding of d/c instructions and denies any further needs at this time. 

## 2018-06-13 NOTE — Discharge Instructions (Signed)
You were seen in the emergency department today for flulike symptoms.  We suspect he may have bacterial sinusitis, we are covering this with Augmentin, an antibiotic.  Please take this as prescribed.  You may also have the flu, we will cover you with Tamiflu, please take this as prescribed as well.  Your blood sugar was high today, it is important that you monitor this at home and take insulin as prescribed.   We have prescribed you new medication(s) today. Discuss the medications prescribed today with your pharmacist as they can have adverse effects and interactions with your other medicines including over the counter and prescribed medications. Seek medical evaluation if you start to experience new or abnormal symptoms after taking one of these medicines, seek care immediately if you start to experience difficulty breathing, feeling of your throat closing, facial swelling, or rash as these could be indications of a more serious allergic reaction  Follow up with primary care within 2-3 days. Return to the Er for new or worsening symptoms or any other concerns.

## 2018-06-13 NOTE — ED Triage Notes (Signed)
Pt is c/o cough, sore throat, earache, and some shortness of breath  Sxs started about a week ago  States he was feeling better and then it came back

## 2018-07-09 ENCOUNTER — Encounter (HOSPITAL_BASED_OUTPATIENT_CLINIC_OR_DEPARTMENT_OTHER): Payer: Self-pay | Admitting: *Deleted

## 2018-07-09 ENCOUNTER — Other Ambulatory Visit: Payer: Self-pay

## 2018-07-09 ENCOUNTER — Emergency Department (HOSPITAL_BASED_OUTPATIENT_CLINIC_OR_DEPARTMENT_OTHER)
Admission: EM | Admit: 2018-07-09 | Discharge: 2018-07-10 | Disposition: A | Discharge status: Home / Self Care | Payer: Medicaid Other | Attending: Emergency Medicine | Admitting: Emergency Medicine

## 2018-07-09 DIAGNOSIS — E1040 Type 1 diabetes mellitus with diabetic neuropathy, unspecified: Secondary | ICD-10-CM | POA: Diagnosis not present

## 2018-07-09 DIAGNOSIS — E1065 Type 1 diabetes mellitus with hyperglycemia: Secondary | ICD-10-CM | POA: Diagnosis not present

## 2018-07-09 DIAGNOSIS — Z794 Long term (current) use of insulin: Secondary | ICD-10-CM | POA: Diagnosis not present

## 2018-07-09 DIAGNOSIS — Z79899 Other long term (current) drug therapy: Secondary | ICD-10-CM | POA: Diagnosis not present

## 2018-07-09 DIAGNOSIS — R739 Hyperglycemia, unspecified: Secondary | ICD-10-CM

## 2018-07-09 DIAGNOSIS — R112 Nausea with vomiting, unspecified: Secondary | ICD-10-CM | POA: Diagnosis present

## 2018-07-09 DIAGNOSIS — E1069 Type 1 diabetes mellitus with other specified complication: Secondary | ICD-10-CM

## 2018-07-09 DIAGNOSIS — F17210 Nicotine dependence, cigarettes, uncomplicated: Secondary | ICD-10-CM | POA: Insufficient documentation

## 2018-07-09 LAB — URINALYSIS, ROUTINE W REFLEX MICROSCOPIC
Bilirubin Urine: NEGATIVE
GLUCOSE, UA: 100 mg/dL — AB
Hgb urine dipstick: NEGATIVE
Ketones, ur: >80 mg/dL — AB
Leukocytes,Ua: NEGATIVE
Nitrite: NEGATIVE
Protein, ur: NEGATIVE mg/dL
Specific Gravity, Urine: 1.015 (ref 1.005–1.030)
pH: 8.5 — ABNORMAL HIGH (ref 5.0–8.0)

## 2018-07-09 LAB — POCT I-STAT EG7
Acid-Base Excess: 2 mmol/L (ref 0.0–2.0)
Bicarbonate: 26.4 mmol/L (ref 20.0–28.0)
Calcium, Ion: 1.16 mmol/L (ref 1.15–1.40)
HCT: 51 % (ref 39.0–52.0)
Hemoglobin: 17.3 g/dL — ABNORMAL HIGH (ref 13.0–17.0)
O2 Saturation: 68 %
Patient temperature: 99.3
Potassium: 5 mmol/L (ref 3.5–5.1)
Sodium: 134 mmol/L — ABNORMAL LOW (ref 135–145)
TCO2: 28 mmol/L (ref 22–32)
pCO2, Ven: 40.9 mmHg — ABNORMAL LOW (ref 44.0–60.0)
pH, Ven: 7.419 (ref 7.250–7.430)
pO2, Ven: 36 mmHg (ref 32.0–45.0)

## 2018-07-09 LAB — BASIC METABOLIC PANEL
Anion gap: 13 (ref 5–15)
BUN: 18 mg/dL (ref 6–20)
CALCIUM: 9.6 mg/dL (ref 8.9–10.3)
CO2: 25 mmol/L (ref 22–32)
Chloride: 95 mmol/L — ABNORMAL LOW (ref 98–111)
Creatinine, Ser: 0.94 mg/dL (ref 0.61–1.24)
GFR calc Af Amer: >60 mL/min (ref >60)
GFR calc non Af Amer: >60 mL/min (ref >60)
Glucose, Bld: 270 mg/dL — ABNORMAL HIGH (ref 70–99)
Potassium: 4.6 mmol/L (ref 3.5–5.1)
Sodium: 133 mmol/L — ABNORMAL LOW (ref 135–145)

## 2018-07-09 LAB — CBC
HCT: 52.3 % — ABNORMAL HIGH (ref 39.0–52.0)
Hemoglobin: 16.9 g/dL (ref 13.0–17.0)
MCH: 27.7 pg (ref 26.0–34.0)
MCHC: 32.3 g/dL (ref 30.0–36.0)
MCV: 85.7 fL (ref 80.0–100.0)
PLATELETS: 304 10*3/uL (ref 150–400)
RBC: 6.1 MIL/uL — ABNORMAL HIGH (ref 4.22–5.81)
RDW: 13.8 % (ref 11.5–15.5)
WBC: 14.2 10*3/uL — ABNORMAL HIGH (ref 4.0–10.5)
nRBC: 0 % (ref 0.0–0.2)

## 2018-07-09 LAB — VALPROIC ACID LEVEL: Valproic Acid Lvl: <10 ug/mL — ABNORMAL LOW (ref 50.0–100.0)

## 2018-07-09 LAB — CBG MONITORING, ED
Glucose-Capillary: 228 mg/dL — ABNORMAL HIGH (ref 70–99)
Glucose-Capillary: 245 mg/dL — ABNORMAL HIGH (ref 70–99)

## 2018-07-09 MED ORDER — ONDANSETRON HCL 4 MG/2ML IJ SOLN
4.0000 mg | Freq: Once | INTRAMUSCULAR | Status: AC | PRN
  Administered 2018-07-09: 4 mg via INTRAVENOUS
  Filled 2018-07-09: qty 2

## 2018-07-09 MED ORDER — ONDANSETRON HCL 4 MG/2ML IJ SOLN: 4.0000 mg | Freq: Once | INTRAMUSCULAR | Status: DC

## 2018-07-09 MED ORDER — INSULIN GLARGINE 100 UNIT/ML ~~LOC~~ SOLN
SUBCUTANEOUS | Status: AC
  Filled 2018-07-09: qty 1

## 2018-07-09 MED ORDER — INSULIN REGULAR HUMAN 100 UNIT/ML IJ SOLN
8.0000 [IU] | Freq: Once | INTRAMUSCULAR | Status: AC
  Administered 2018-07-09: 8 [IU] via SUBCUTANEOUS
  Filled 2018-07-09: qty 1

## 2018-07-09 MED ORDER — ONDANSETRON 4 MG PO TBDP: 4.0000 mg | ORAL_TABLET | ORAL | 0 refills | Status: DC | PRN

## 2018-07-09 MED ORDER — SODIUM CHLORIDE 0.9 % IV BOLUS
2000.0000 mL | Freq: Once | INTRAVENOUS | Status: AC
  Administered 2018-07-09: 2000 mL via INTRAVENOUS

## 2018-07-09 MED ORDER — INSULIN GLARGINE 100 UNIT/ML ~~LOC~~ SOLN
32.0000 [IU] | Freq: Once | SUBCUTANEOUS | Status: AC
  Administered 2018-07-09: 32 [IU] via SUBCUTANEOUS

## 2018-07-09 NOTE — ED Provider Notes (Signed)
[facility] EMERGENCY DEPARTMENT Provider Note   CSN: [id number] Arrival date & time: 07/09/18  2025    History   Chief Complaint Chief Complaint  Patient presents with  . Emesis  . Hyperglycemia    HPI Rodney [name] is a 18 y.o. male.     HPI Patient has type 1 diabetes with prior history of DKA.  He reports that he was feeling fine earlier today but then quickly started having vomiting and nausea.  He vomited much of the day.  He reports he was having some diffuse sharp abdominal pains at that time.  He reports feeling you seem different is that he ate a steak and onion sandwich last night.  That was at about 5 PM yesterday.  He was fine all night and then part of the morning.  He reports he has been urinating quite a bit.  No confusion no incoordination.  No chest pain no shortness of breath. Past Medical History:  Diagnosis Date  . Abrasion of arm, right 08/22/2011  . Bipolar 1 disorder ([place])   . Depression   . Diabetes mellitus    Type I - insulin pump  . Diabetes mellitus type I ([place])   . Nasal fracture 08/13/2011   was hit in nose while playing basketball  . Seizures ([place])    x 3 - due to low blood sugar - last time 5-6 yrs. ago    Patient Active Problem List   Diagnosis Date Noted  . 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 GERD (gastroesophageal reflux disease) 09/15/2015  . Unintentional weight loss 09/15/2015  . Injury of right hand 07/28/2015  . Diabetic ketoacidosis without coma associated with type 1 diabetes mellitus ([place])   . Ketonuria 04/17/2014  . Poorly controlled type 1 diabetes mellitus ([place]) 04/17/2014  . Hyperglycemia due to type 1 diabetes mellitus ([place]) 03/18/2014  . Diabetic ketoacidosis ([place]) 05/20/2013  . Dehydration 05/06/2013  . Non compliance with medical treatment 05/06/2013  . DKA (diabetic ketoacidoses) ([place]) 05/05/2013  . Diabetes ([place]) 03/25/2013  . Mild Diabetic  ketoacidosis 03/25/2013  . Nausea with vomiting 03/25/2013  . Adjustment reaction 12/05/2012  . DKA (diabetic ketoacidosis) ([place]) 12/04/2012  . DKA, type 1 ([place]) 12/04/2012  . Non compliance w medication regimen 11/21/2012  . 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[place]) 08/02/2012  . 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 Fracture, nasal 08/24/2011    Class: Acute  . Goiter   . Hypoglycemia associated with diabetes ([place]) 05/23/2011  . 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 Diabetic autonomic neuropathy ([place]) 05/23/2011  . Tachycardia 05/23/2011  . Type I (juvenile type) diabetes mellitus without mention of complication, uncontrolled 08/30/2010  . 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 APPENDECTOMY    . CLOSED REDUCTION NASAL FRACTURE  08/24/2011   Procedure: CLOSED REDUCTION NASAL FRACTURE;  Surgeon: [name] Belfast, MD;  Location: [facility];  Service: ENT;  Laterality: N/A;        Home Medications    Prior to Admission medications   Medication Sig Start Date End Date Taking? Authorizing Provider  ACCU-CHEK FASTCLIX LANCETS MISC Check sugar 10 x daily 02/20/18 02/21/19  Sherrlyn Hock, MD  amoxicillin-clavulanate (AUGMENTIN) 875-125 MG tablet Take 1 tablet by mouth every 12 (twelve) hours. 06/13/18   Petrucelli, Samantha R, PA-C  DEPAKOTE 250 MG DR tablet Take two  tablets each evening. Patient taking differently: Take 500 mg by mouth every evening.  09/10/15 09/09/16  Sherrlyn Hock, MD  glucagon (GLUCAGON EMERGENCY) 1 MG injection Inject 1 mg into the vein once as needed (for hypoglycemia). 02/20/18   Sherrlyn Hock, MD  glucose blood (ACCU-CHEK GUIDE) test strip Use 6x daily to check glucose 04/05/18   Sherrlyn Hock, MD  insulin aspart (NOVOLOG FLEXPEN) 100 UNIT/ML FlexPen Use up to 50 units daily. 02/23/18   Sherrlyn Hock,  MD  Insulin Glargine (LANTUS SOLOSTAR) 100 UNIT/ML Solostar Pen Inject up to 50 units daily 04/05/18   Sherrlyn Hock, MD  Insulin Pen Needle (BD PEN NEEDLE NANO U/F) 32G X 4 MM MISC USE TO INJECT INSULIN VIA INSULIN PEN 7 TIME A DAY 05/28/18   Sherrlyn Hock, MD  omeprazole (PRILOSEC) 40 MG capsule Take one daily. 02/20/18 02/21/19  Sherrlyn Hock, MD  ondansetron (ZOFRAN ODT) 4 MG disintegrating tablet Take 1 tablet (4 mg total) by mouth every 4 (four) hours as needed for nausea or vomiting. 07/09/18   Charlesetta Shanks, MD  oseltamivir (TAMIFLU) 75 MG capsule Take 1 capsule (75 mg total) by mouth every 12 (twelve) hours. 06/13/18   Petrucelli, Glynda Jaeger, PA-C    Family History Family History  Problem Relation Age of Onset  . Heart disease Mother        MI at age 74  . Diabetes Paternal Grandfather        type 2  . Hypertension Paternal Grandfather   . Heart disease Paternal Grandfather   . Hepatitis C Brother        older brother (incarcerated)    Social History Social History   Tobacco Use  . Smoking status: Current Every Day Smoker    Types: Cigarettes  . Smokeless tobacco: Never Used  Substance Use Topics  . Alcohol use: Yes    Comment: social  . Drug use: No     Allergies   Patient has no known allergies.   Review of Systems Review of Systems 10 Systems reviewed and are negative for acute change except as noted in the HPI.  Physical Exam Updated Vital Signs BP 133/63   Pulse 97   Temp 99.3 F (37.4 C) (Oral)   Resp 18   Ht 5\' 6"  (1.676 m)   Wt 60.7 kg   SpO2 100%   BMI 21.60 kg/m   Physical Exam Constitutional:      Comments: Patient is alert and nontoxic.  Mental status is clear.  No respiratory distress.  Patient does not smell of ketones.  HENT:     Nose: Nose normal.     Mouth/Throat:     Mouth: Mucous membranes are moist.     Pharynx: Oropharynx is clear.  Eyes:     Extraocular Movements: Extraocular movements intact.     Pupils:  Pupils are equal, round, and reactive to light.  Neck:     Musculoskeletal: Neck supple.  Cardiovascular:     Rate and Rhythm: Normal rate and regular rhythm.     Pulses: Normal pulses.     Heart sounds: Normal heart sounds.  Pulmonary:     Effort: Pulmonary effort is normal.     Breath sounds: Normal breath sounds.  Abdominal:     General: There is no distension.     Palpations: Abdomen is soft.     Tenderness: There is no abdominal tenderness. There is no guarding.  Musculoskeletal: Normal range of  motion.     Right lower leg: No edema.     Left lower leg: No edema.  Skin:    General: Skin is warm and dry.  Neurological:     General: No focal deficit present.     Mental Status: He is oriented to person, place, and time.     Coordination: Coordination normal.  Psychiatric:        Mood and Affect: Mood normal.      ED Treatments / Results  Labs (all labs ordered are listed, but only abnormal results are displayed) Labs Reviewed  BASIC METABOLIC PANEL - Abnormal; Notable for the following components:      Result Value   Sodium 133 (*)    Chloride 95 (*)    Glucose, Bld 270 (*)    All other components within normal limits  CBC - Abnormal; Notable for the following components:   WBC 14.2 (*)    RBC 6.10 (*)    HCT 52.3 (*)    All other components within normal limits  URINALYSIS, ROUTINE W REFLEX MICROSCOPIC - Abnormal; Notable for the following components:   pH 8.5 (*)    Glucose, UA 100 (*)    Ketones, ur >80 (*)    All other components within normal limits  VALPROIC ACID LEVEL - Abnormal; Notable for the following components:   Valproic Acid Lvl <10 (*)    All other components within normal limits  CBG MONITORING, ED - Abnormal; Notable for the following components:   Glucose-Capillary 228 (*)    All other components within normal limits  POCT I-STAT EG7 - Abnormal; Notable for the following components:   pCO2, Ven 40.9 (*)    Sodium 134 (*)    Hemoglobin  17.3 (*)    All other components within normal limits  CBG MONITORING, ED - Abnormal; Notable for the following components:   Glucose-Capillary 245 (*)    All other components within normal limits  I-STAT VENOUS BLOOD GAS, ED    EKG None  Radiology No results found.  Procedures Procedures (including critical care time) CRITICAL CARE Performed by: Charlesetta Shanks   Total critical care time: 30 minutes  Critical care time was exclusive of separately billable procedures and treating other patients.  Critical care was necessary to treat or prevent imminent or life-threatening deterioration.  Critical care was time spent personally by me on the following activities: development of treatment plan with patient and/or surrogate as well as nursing, discussions with consultants, evaluation of patient's response to treatment, examination of patient, obtaining history from patient or surrogate, ordering and performing treatments and interventions, ordering and review of laboratory studies, ordering and review of radiographic studies, pulse oximetry and re-evaluation of patient's condition. Medications Ordered in ED Medications  ondansetron (ZOFRAN) injection 4 mg (has no administration in time range)  ondansetron (ZOFRAN) injection 4 mg (4 mg Intravenous Given 07/09/18 2059)  sodium chloride 0.9 % bolus 2,000 mL (0 mLs Intravenous Stopped 07/09/18 2347)  insulin glargine (LANTUS) injection 32 Units (32 Units Subcutaneous Given 07/09/18 2210)  insulin regular (NOVOLIN R,HUMULIN R) 100 units/mL injection 8 Units (8 Units Subcutaneous Given 07/09/18 2347)     Initial Impression / Assessment and Plan / ED Course  I have reviewed the triage vital signs and the nursing notes.  Pertinent labs & imaging results that were available during my care of the patient were reviewed by me and considered in my medical decision making (see chart for details).  Clinical Course  as of Jul 08 2357  Mon Jul 09, 2018    2307 Patient is alert and nontoxic.  He is tolerating oral fluids without difficulty.  He reports he feels well.  Will complete his rehydration and plan for anticipated discharge.   [MP]    Clinical Course User Index [MP] Charlesetta Shanks, MD       Patient presents with vomiting starting earlier today.  Blood sugars are moderately elevated in the mid 200s.  Patient however does not have any signs of DKA.  He has been rehydrated with 2 L of fluids.  His evening Lantus is given.  A one-time dose of regular insulin 8 units given.  He is alert and appropriate.  No tachypnea, clear mental status.  Symptoms have improved significantly.  At the time of evaluation he was ready to start trying fluids.  He is drinking without difficulty.  He has had no further vomiting.  At this time stable for discharge.  Return precautions reviewed.  Final Clinical Impressions(s) / ED Diagnoses   Final diagnoses:  Hyperglycemia  Type 1 diabetes mellitus with other specified complication (HCC)  Non-intractable vomiting with nausea, unspecified vomiting type    ED Discharge Orders         Ordered    ondansetron (ZOFRAN ODT) 4 MG disintegrating tablet  Every 4 hours PRN     07/09/18 2356           Charlesetta Shanks, MD 07/10/18 0001

## 2018-07-09 NOTE — Discharge Instructions (Signed)
1.  Continue your insulin and monitor your blood sugars. 2.  Take Zofran if needed for nausea. 3.  See your family doctor for recheck within the next 2 to 3 days. 4.  Return to the emergency department if you have recurrence of nausea vomiting or your blood sugars are not controlled.

## 2018-07-09 NOTE — ED Triage Notes (Signed)
Vomiting all day. Sharp abdominal pain.

## 2018-07-09 NOTE — ED Notes (Signed)
Pt tolerating PO fluids, would like to go home

## 2018-07-10 NOTE — ED Notes (Signed)
Pt verbalizes understanding of d/c instructions and denies any further needs at this time. 

## 2018-09-26 ENCOUNTER — Emergency Department (HOSPITAL_COMMUNITY)
Admission: EM | Admit: 2018-09-26 | Discharge: 2018-09-26 | Disposition: A | Discharge status: Home / Self Care | Payer: Medicaid Other | Attending: Emergency Medicine | Admitting: Emergency Medicine

## 2018-09-26 ENCOUNTER — Emergency Department (HOSPITAL_COMMUNITY): Payer: Medicaid Other

## 2018-09-26 ENCOUNTER — Encounter (HOSPITAL_COMMUNITY): Payer: Self-pay | Admitting: Emergency Medicine

## 2018-09-26 ENCOUNTER — Other Ambulatory Visit: Payer: Self-pay

## 2018-09-26 DIAGNOSIS — S60511A Abrasion of right hand, initial encounter: Secondary | ICD-10-CM | POA: Diagnosis not present

## 2018-09-26 DIAGNOSIS — F17210 Nicotine dependence, cigarettes, uncomplicated: Secondary | ICD-10-CM | POA: Insufficient documentation

## 2018-09-26 DIAGNOSIS — F419 Anxiety disorder, unspecified: Secondary | ICD-10-CM | POA: Insufficient documentation

## 2018-09-26 DIAGNOSIS — E109 Type 1 diabetes mellitus without complications: Secondary | ICD-10-CM | POA: Insufficient documentation

## 2018-09-26 DIAGNOSIS — Y9389 Activity, other specified: Secondary | ICD-10-CM | POA: Diagnosis not present

## 2018-09-26 DIAGNOSIS — Y929 Unspecified place or not applicable: Secondary | ICD-10-CM | POA: Diagnosis not present

## 2018-09-26 DIAGNOSIS — Z79899 Other long term (current) drug therapy: Secondary | ICD-10-CM | POA: Insufficient documentation

## 2018-09-26 DIAGNOSIS — Z794 Long term (current) use of insulin: Secondary | ICD-10-CM | POA: Insufficient documentation

## 2018-09-26 DIAGNOSIS — S60512A Abrasion of left hand, initial encounter: Secondary | ICD-10-CM | POA: Insufficient documentation

## 2018-09-26 DIAGNOSIS — W2209XA Striking against other stationary object, initial encounter: Secondary | ICD-10-CM | POA: Diagnosis not present

## 2018-09-26 DIAGNOSIS — S62339A Displaced fracture of neck of unspecified metacarpal bone, initial encounter for closed fracture: Secondary | ICD-10-CM

## 2018-09-26 DIAGNOSIS — S62317A Displaced fracture of base of fifth metacarpal bone. left hand, initial encounter for closed fracture: Secondary | ICD-10-CM | POA: Insufficient documentation

## 2018-09-26 DIAGNOSIS — Y999 Unspecified external cause status: Secondary | ICD-10-CM | POA: Diagnosis not present

## 2018-09-26 DIAGNOSIS — S6992XA Unspecified injury of left wrist, hand and finger(s), initial encounter: Secondary | ICD-10-CM | POA: Diagnosis present

## 2018-09-26 MED ORDER — TRAMADOL HCL 50 MG PO TABS: 50.0000 mg | ORAL_TABLET | Freq: Four times a day (QID) | ORAL | 0 refills | Status: DC | PRN

## 2018-09-26 NOTE — ED Provider Notes (Signed)
[facility] DEPT Provider Note   CSN: [id number] Arrival date & time: 09/26/18  2048    History   Chief Complaint Chief Complaint  Patient presents with  . Hand Pain    HPI Rodney [name] is a 18 y.o. male.     HPI Presents 2 days after sustaining injury to both hands. Patient notes a history of insulin-dependent diabetes, other medical issues, states that he takes his medication regularly, was in his usual state of health until an event. He recalls being agitated, striking a wall repeatedly with both hands He sustained superficial injuries to knuckles on both hands, but since the event has had particular pain about the left fifth digit proximally, worse with motion. No inability to move the finger. It is unclear if he is taken any medication for pain control. Past Medical History:  Diagnosis Date  . Abrasion of arm, right 08/22/2011  . Bipolar 1 disorder ([place])   . Depression   . Diabetes mellitus    Type I - insulin pump  . Diabetes mellitus type I ([place])   . Nasal fracture 08/13/2011   was hit in nose while playing basketball  . Seizures ([place])    x 3 - due to low blood sugar - last time 5-6 yrs. ago    Patient Active Problem List   Diagnosis Date Noted  . 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 GERD (gastroesophageal reflux disease) 09/15/2015  . Unintentional weight loss 09/15/2015  . Injury of right hand 07/28/2015  . Diabetic ketoacidosis without coma associated with type 1 diabetes mellitus ([place])   . Ketonuria 04/17/2014  . Poorly controlled type 1 diabetes mellitus ([place]) 04/17/2014  . Hyperglycemia due to type 1 diabetes mellitus ([place]) 03/18/2014  . Diabetic ketoacidosis ([place]) 05/20/2013  . Dehydration 05/06/2013  . Non compliance with medical treatment 05/06/2013  . DKA (diabetic ketoacidoses) ([place]) 05/05/2013  . Diabetes ([place]) 03/25/2013  . Mild Diabetic ketoacidosis  03/25/2013  . Nausea with vomiting 03/25/2013  . Adjustment reaction 12/05/2012  . DKA (diabetic ketoacidosis) ([place]) 12/04/2012  . DKA, type 1 ([place]) 12/04/2012  . Non compliance w medication regimen 11/21/2012  . 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[place]) 08/02/2012  . 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 Fracture, nasal 08/24/2011    Class: Acute  . Goiter   . Hypoglycemia associated with diabetes ([place]) 05/23/2011  . 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 Diabetic autonomic neuropathy ([place]) 05/23/2011  . Tachycardia 05/23/2011  . Type I (juvenile type) diabetes mellitus without mention of complication, uncontrolled 08/30/2010  . 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 APPENDECTOMY    . CLOSED REDUCTION NASAL FRACTURE  08/24/2011   Procedure: CLOSED REDUCTION NASAL FRACTURE;  Surgeon: [name] Belfast, MD;  Location: [facility];  Service: ENT;  Laterality: N/A;        Home Medications    Prior to Admission medications   Medication Sig Start Date End Date Taking? Authorizing Provider  ACCU-CHEK FASTCLIX LANCETS MISC Check sugar 10 x daily 02/20/18 02/21/19  Sherrlyn Hock, MD  amoxicillin-clavulanate (AUGMENTIN) 875-125 MG tablet Take 1 tablet by mouth every 12 (twelve) hours. 06/13/18   Petrucelli, Samantha R, PA-C  DEPAKOTE 250 MG DR tablet Take two tablets each evening. Patient taking differently: Take 500 mg by mouth every evening.  09/10/15 09/09/16  Sherrlyn Hock, MD  glucagon (GLUCAGON EMERGENCY) 1 MG injection Inject 1 mg into the vein once as needed (for hypoglycemia). 02/20/18   Sherrlyn Hock, MD  glucose blood (ACCU-CHEK GUIDE) test strip Use 6x daily to check glucose 04/05/18   Sherrlyn Hock, MD  insulin aspart (NOVOLOG FLEXPEN) 100 UNIT/ML FlexPen Use up to 50 units daily. 02/23/18   Sherrlyn Hock, MD  Insulin  Glargine (LANTUS SOLOSTAR) 100 UNIT/ML Solostar Pen Inject up to 50 units daily 04/05/18   Sherrlyn Hock, MD  Insulin Pen Needle (BD PEN NEEDLE NANO U/F) 32G X 4 MM MISC USE TO INJECT INSULIN VIA INSULIN PEN 7 TIME A DAY 05/28/18   Sherrlyn Hock, MD  omeprazole (PRILOSEC) 40 MG capsule Take one daily. 02/20/18 02/21/19  Sherrlyn Hock, MD  ondansetron (ZOFRAN ODT) 4 MG disintegrating tablet Take 1 tablet (4 mg total) by mouth every 4 (four) hours as needed for nausea or vomiting. 07/09/18   Charlesetta Shanks, MD  oseltamivir (TAMIFLU) 75 MG capsule Take 1 capsule (75 mg total) by mouth every 12 (twelve) hours. 06/13/18   Petrucelli, Samantha R, PA-C  traMADol (ULTRAM) 50 MG tablet Take 1 tablet (50 mg total) by mouth every 6 (six) hours as needed. 09/26/18   Carmin Muskrat, MD    Family History Family History  Problem Relation Age of Onset  . Heart disease Mother        MI at age 5  . Diabetes Paternal Grandfather        type 2  . Hypertension Paternal Grandfather   . Heart disease Paternal Grandfather   . Hepatitis C Brother        older brother (incarcerated)    Social History Social History   Tobacco Use  . Smoking status: Current Every Day Smoker    Types: Cigarettes  . Smokeless tobacco: Never Used  Substance Use Topics  . Alcohol use: Yes    Comment: social  . Drug use: No     Allergies   Patient has no known allergies.   Review of Systems Review of Systems  Constitutional:       Per HPI, otherwise negative  HENT:       Per HPI, otherwise negative  Respiratory:       Per HPI, otherwise negative  Cardiovascular:       Per HPI, otherwise negative  Gastrointestinal: Negative for vomiting.  Endocrine:       Negative aside from HPI  Genitourinary:       Neg aside from HPI   Musculoskeletal:       Per HPI, otherwise negative  Skin: Positive for wound.  Neurological: Negative for syncope.  Psychiatric/Behavioral: The patient is nervous/anxious.       Physical Exam Updated Vital Signs BP 124/77 (BP Location: Right Arm)   Pulse 92   Temp 98.7 F (37.1 C) (Oral)   Resp 16   SpO2 98%   Physical Exam Vitals signs and nursing note reviewed.  Constitutional:      General: He is not in acute distress.    Appearance: He is well-developed.  HENT:     Head: Normocephalic and atraumatic.  Eyes:     Conjunctiva/sclera: Conjunctivae normal.  Cardiovascular:     Rate and Rhythm: Normal rate and regular rhythm.     Pulses: Normal pulses.  Pulmonary:     Effort: Pulmonary effort is normal. No respiratory distress.     Breath sounds: No stridor.  Abdominal:  General: There is no distension.  Musculoskeletal:     Right elbow: Normal.    Left elbow: Normal.       Arms:  Skin:    General: Skin is warm and dry.  Neurological:     Mental Status: He is alert and oriented to person, place, and time.      ED Treatments / Results   Radiology Dg Hand Complete Left  Result Date: 09/26/2018 CLINICAL DATA:  Punched wall 2 days ago with persistent hand and wrist pain, initial encounter EXAM: LEFT HAND - COMPLETE 3+ VIEW COMPARISON:  None. FINDINGS: There is an oblique fracture through the base of the fifth metacarpal. No significant displacement is seen. No other focal bony abnormality is noted. IMPRESSION: Fracture through the base of the fifth metacarpal. Electronically Signed   By: Inez Catalina M.D.   On: 09/26/2018 21:22    Procedures Procedures (including critical care time)  Medications Ordered in ED Medications - No data to display   Initial Impression / Assessment and Plan / ED Course  I have reviewed the triage vital signs and the nursing notes.  Pertinent labs & imaging results that were available during my care of the patient were reviewed by me and considered in my medical decision making (see chart for details).       I reviewed the patient's x-ray with him, presented him with a paper copy of the result.  This  young male presents 2 days after assaulting a wall, now with pain in the hand bilaterally. Right hand unremarkable, aside from superficial wounds. Left hand, however, has swelling, difficulty with flexion of the fifth digit, and x-rays consistent with boxer's fracture Patient had immobilization required by our orthopedic technician, was discharged in stable condition.  Final Clinical Impressions(s) / ED Diagnoses   Final diagnoses:  Closed boxer's fracture, initial encounter    ED Discharge Orders         Ordered    traMADol (ULTRAM) 50 MG tablet  Every 6 hours PRN     09/26/18 2218           Carmin Muskrat, MD 09/26/18 2228

## 2018-09-26 NOTE — ED Triage Notes (Signed)
Patient reports punching a wall two days ago. C/o pain to left hand. Scabbing noted to bilateral hands.

## 2018-09-26 NOTE — Discharge Instructions (Addendum)
Please be sure to schedule follow-up with our orthopedic colleagues.  In addition to the prescribed pain medication, please use ibuprofen, 200 mg, 3 times daily for the next 5 days for additional relief.  Return here for concerning changes in your condition.

## 2018-10-22 ENCOUNTER — Other Ambulatory Visit (INDEPENDENT_AMBULATORY_CARE_PROVIDER_SITE_OTHER): Payer: Self-pay | Admitting: "Endocrinology

## 2018-10-25 ENCOUNTER — Other Ambulatory Visit (INDEPENDENT_AMBULATORY_CARE_PROVIDER_SITE_OTHER): Payer: Self-pay | Admitting: "Endocrinology

## 2018-11-13 ENCOUNTER — Emergency Department (HOSPITAL_COMMUNITY)
Admission: EM | Admit: 2018-11-13 | Discharge: 2018-11-13 | Discharge status: Against Medical Advice | Payer: Medicaid Other

## 2018-11-15 ENCOUNTER — Encounter (HOSPITAL_COMMUNITY): Payer: Self-pay

## 2018-11-15 ENCOUNTER — Other Ambulatory Visit: Payer: Self-pay

## 2018-11-15 ENCOUNTER — Emergency Department (HOSPITAL_COMMUNITY)
Admission: EM | Admit: 2018-11-15 | Discharge: 2018-11-15 | Disposition: A | Discharge status: Home / Self Care | Payer: Medicaid Other | Attending: Emergency Medicine | Admitting: Emergency Medicine

## 2018-11-15 ENCOUNTER — Emergency Department (HOSPITAL_COMMUNITY): Payer: Medicaid Other

## 2018-11-15 DIAGNOSIS — F17210 Nicotine dependence, cigarettes, uncomplicated: Secondary | ICD-10-CM | POA: Insufficient documentation

## 2018-11-15 DIAGNOSIS — W2209XA Striking against other stationary object, initial encounter: Secondary | ICD-10-CM | POA: Insufficient documentation

## 2018-11-15 DIAGNOSIS — Y999 Unspecified external cause status: Secondary | ICD-10-CM | POA: Insufficient documentation

## 2018-11-15 DIAGNOSIS — Z79899 Other long term (current) drug therapy: Secondary | ICD-10-CM | POA: Insufficient documentation

## 2018-11-15 DIAGNOSIS — Z794 Long term (current) use of insulin: Secondary | ICD-10-CM | POA: Diagnosis not present

## 2018-11-15 DIAGNOSIS — S62307A Unspecified fracture of fifth metacarpal bone, left hand, initial encounter for closed fracture: Secondary | ICD-10-CM | POA: Insufficient documentation

## 2018-11-15 DIAGNOSIS — Y939 Activity, unspecified: Secondary | ICD-10-CM | POA: Diagnosis not present

## 2018-11-15 DIAGNOSIS — Y929 Unspecified place or not applicable: Secondary | ICD-10-CM | POA: Diagnosis not present

## 2018-11-15 DIAGNOSIS — S6992XA Unspecified injury of left wrist, hand and finger(s), initial encounter: Secondary | ICD-10-CM | POA: Diagnosis present

## 2018-11-15 DIAGNOSIS — E109 Type 1 diabetes mellitus without complications: Secondary | ICD-10-CM | POA: Insufficient documentation

## 2018-11-15 NOTE — ED Notes (Signed)
Patient transported to X-ray 

## 2018-11-15 NOTE — ED Provider Notes (Signed)
[facility] DEPT Provider Note   CSN: [id number] Arrival date & time: 11/15/18  [place]    History   Chief Complaint Chief Complaint  Patient presents with  . Hand Injury    HPI Rodney [name] is a 18 y.o. male.     HPI Patient presents to the emergency room for evaluation of hand pain.  Patient states he punched a refrigerator a couple days ago and anger.  Since then he has noticed swelling and pain on the ulnar aspect of his left hand.  Patient denies any other injuries.  No numbness or weakness.  Palpation and movement increase the pain. Past Medical History:  Diagnosis Date  . Abrasion of arm, right 08/22/2011  . Bipolar 1 disorder ([place])   . Depression   . Diabetes mellitus    Type I - insulin pump  . Diabetes mellitus type I ([place])   . Nasal fracture 08/13/2011   was hit in nose while playing basketball  . Seizures ([place])    x 3 - due to low blood sugar - last time 5-6 yrs. ago    Patient Active Problem List   Diagnosis Date Noted  . 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 GERD (gastroesophageal reflux disease) 09/15/2015  . Unintentional weight loss 09/15/2015  . Injury of right hand 07/28/2015  . Diabetic ketoacidosis without coma associated with type 1 diabetes mellitus ([place])   . Ketonuria 04/17/2014  . Poorly controlled type 1 diabetes mellitus ([place]) 04/17/2014  . Hyperglycemia due to type 1 diabetes mellitus ([place]) 03/18/2014  . Diabetic ketoacidosis ([place]) 05/20/2013  . Dehydration 05/06/2013  . Non compliance with medical treatment 05/06/2013  . DKA (diabetic ketoacidoses) ([place]) 05/05/2013  . Diabetes ([place]) 03/25/2013  . Mild Diabetic ketoacidosis 03/25/2013  . Nausea with vomiting 03/25/2013  . Adjustment reaction 12/05/2012  . DKA (diabetic ketoacidosis) ([place]) 12/04/2012  . DKA, type 1 ([place]) 12/04/2012  . Non compliance w medication regimen 11/21/2012  . Diabetic  peripheral neuropathy associated with type 1 diabetes mellitus ([place]) 08/02/2012  . 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 Fracture, nasal 08/24/2011    Class: Acute  . Goiter   . Hypoglycemia associated with diabetes ([place]) 05/23/2011  . 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 Diabetic autonomic neuropathy ([place]) 05/23/2011  . Tachycardia 05/23/2011  . Type I (juvenile type) diabetes mellitus without mention of complication, uncontrolled 08/30/2010  . 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 APPENDECTOMY    . CLOSED REDUCTION NASAL FRACTURE  08/24/2011   Procedure: CLOSED REDUCTION NASAL FRACTURE;  Surgeon: [name] Belfast, MD;  Location: [facility];  Service: ENT;  Laterality: N/A;        Home Medications    Prior to Admission medications   Medication Sig Start Date End Date Taking? Authorizing Provider  ACCU-CHEK FASTCLIX LANCETS MISC Check sugar 10 x daily 02/20/18 02/21/19  Sherrlyn Hock, MD  ACCU-CHEK GUIDE test strip USE 6X DAILY TO CHECK GLUCOSE 10/25/18   Sherrlyn Hock, MD  amoxicillin-clavulanate (AUGMENTIN) 875-125 MG tablet Take 1 tablet by mouth every 12 (twelve) hours. 06/13/18   Petrucelli, Samantha R, PA-C  DEPAKOTE 250 MG DR tablet Take two tablets each evening. Patient taking differently: Take 500 mg by mouth every evening.  09/10/15 09/09/16  Sherrlyn Hock, MD  glucagon (GLUCAGON EMERGENCY) 1 MG injection Inject 1 mg into the vein once  as needed (for hypoglycemia). 02/20/18   Sherrlyn Hock, MD  insulin aspart (NOVOLOG FLEXPEN) 100 UNIT/ML FlexPen Use up to 50 units daily. 02/23/18   Sherrlyn Hock, MD  Insulin Glargine (LANTUS SOLOSTAR) 100 UNIT/ML Solostar Pen Inject up to 50 units daily 04/05/18   Sherrlyn Hock, MD  Insulin Pen Needle (BD PEN NEEDLE NANO U/F) 32G X 4 MM MISC USE TO INJECT INSULIN VIA INSULIN PEN 7 TIME A DAY 05/28/18   Sherrlyn Hock, MD  omeprazole (PRILOSEC) 40 MG capsule TAKE 1 CAPSULE BY MOUTH EVERY DAY 10/22/18   Sherrlyn Hock, MD  ondansetron (ZOFRAN ODT) 4 MG disintegrating tablet Take 1 tablet (4 mg total) by mouth every 4 (four) hours as needed for nausea or vomiting. 07/09/18   Charlesetta Shanks, MD  oseltamivir (TAMIFLU) 75 MG capsule Take 1 capsule (75 mg total) by mouth every 12 (twelve) hours. 06/13/18   Petrucelli, Samantha R, PA-C  traMADol (ULTRAM) 50 MG tablet Take 1 tablet (50 mg total) by mouth every 6 (six) hours as needed. 09/26/18   Carmin Muskrat, MD    Family History Family History  Problem Relation Age of Onset  . Heart disease Mother        MI at age 76  . Diabetes Paternal Grandfather        type 2  . Hypertension Paternal Grandfather   . Heart disease Paternal Grandfather   . Hepatitis C Brother        older brother (incarcerated)  . Hypertension Father     Social History Social History   Tobacco Use  . Smoking status: Current Every Day Smoker    Packs/day: 1.00    Types: Cigarettes  . Smokeless tobacco: Never Used  Substance Use Topics  . Alcohol use: Yes    Comment: social  . Drug use: No     Allergies   Patient has no known allergies.   Review of Systems Review of Systems  All other systems reviewed and are negative.    Physical Exam Updated Vital Signs BP 119/65 (BP Location: Right Arm)   Pulse 78   Temp 98.2 F (36.8 C) (Oral)   Resp 16   Ht 1.676 m (5\' 6" )   Wt 61.7 kg   SpO2 100%   BMI 21.95 kg/m   Physical Exam Vitals signs and nursing note reviewed.  Constitutional:      General: He is not in acute distress.    Appearance: He is well-developed.  HENT:     Head: Normocephalic and atraumatic.     Right Ear: External ear normal.     Left Ear: External ear normal.  Eyes:     General: No scleral icterus.       Right eye: No discharge.        Left eye: No discharge.     Conjunctiva/sclera: Conjunctivae normal.  Neck:      Musculoskeletal: Neck supple.     Trachea: No tracheal deviation.  Cardiovascular:     Rate and Rhythm: Normal rate.  Pulmonary:     Effort: Pulmonary effort is normal. No respiratory distress.     Breath sounds: No stridor.  Abdominal:     General: There is no distension.  Musculoskeletal:        General: No swelling or deformity.     Left wrist: Normal.     Left hand: He exhibits tenderness, bony tenderness and swelling. Normal sensation noted. Normal strength noted.  Skin:  General: Skin is warm and dry.     Findings: No rash.  Neurological:     Mental Status: He is alert.     Cranial Nerves: Cranial nerve deficit: no gross deficits.      ED Treatments / Results   Radiology Dg Hand Complete Left  Result Date: 11/15/2018 CLINICAL DATA:  Patient with injury to the hand. Fifth metacarpal pain EXAM: LEFT HAND - COMPLETE 3+ VIEW COMPARISON:  Hand radiograph 09/26/2018 FINDINGS: There is a comminuted fracture through the distal aspect of the fifth metacarpal with mild volar displacement of the metacarpal head. Overlying soft tissue swelling. No evidence for acute fractures or dislocation. Healed fracture through the base of the fifth metacarpal from prior fracture. IMPRESSION: Mildly displaced comminuted fracture of the distal fifth metacarpal. Overlying soft tissue swelling. Electronically Signed   By: Lovey Newcomer M.D.   On: 11/15/2018 19:25    Procedures Procedures (including critical care time)  Medications Ordered in ED Medications - No data to display   Initial Impression / Assessment and Plan / ED Course  I have reviewed the triage vital signs and the nursing notes.  Pertinent labs & imaging results that were available during my care of the patient were reviewed by me and considered in my medical decision making (see chart for details).      X-rays show 5 metacarpal fracture.  Splint placed by orthopedic tech.  Patient will be referred to orthopedic hand surgery for  outpatient treatment.  Final Clinical Impressions(s) / ED Diagnoses   Final diagnoses:  Closed displaced fracture of fifth metacarpal bone of left hand, unspecified portion of metacarpal, initial encounter    ED Discharge Orders    None       Dorie Rank, MD 11/15/18 [phone]

## 2018-11-15 NOTE — ED Triage Notes (Signed)
Patient reports that he hit a refrigerator with his left hand 2 days ago and is having pain and swelling.

## 2018-11-15 NOTE — Discharge Instructions (Signed)
Apply ice to help with the swelling, take over-the-counter medications for pain as needed

## 2018-12-17 ENCOUNTER — Other Ambulatory Visit (INDEPENDENT_AMBULATORY_CARE_PROVIDER_SITE_OTHER): Payer: Self-pay | Admitting: "Endocrinology

## 2019-01-11 ENCOUNTER — Ambulatory Visit (INDEPENDENT_AMBULATORY_CARE_PROVIDER_SITE_OTHER): Payer: Medicaid Other | Admitting: "Endocrinology

## 2019-01-15 ENCOUNTER — Ambulatory Visit (INDEPENDENT_AMBULATORY_CARE_PROVIDER_SITE_OTHER): Payer: Medicaid Other | Admitting: "Endocrinology

## 2019-01-16 ENCOUNTER — Other Ambulatory Visit: Payer: Self-pay

## 2019-01-16 ENCOUNTER — Encounter (INDEPENDENT_AMBULATORY_CARE_PROVIDER_SITE_OTHER): Payer: Self-pay | Admitting: "Endocrinology

## 2019-01-16 ENCOUNTER — Ambulatory Visit (INDEPENDENT_AMBULATORY_CARE_PROVIDER_SITE_OTHER): Payer: Medicaid Other | Admitting: "Endocrinology

## 2019-01-16 VITALS — BP 132/78 | HR 100 | Ht 66.54 in | Wt 135.8 lb

## 2019-01-16 DIAGNOSIS — E1043 Type 1 diabetes mellitus with diabetic autonomic (poly)neuropathy: Secondary | ICD-10-CM

## 2019-01-16 DIAGNOSIS — E1042 Type 1 diabetes mellitus with diabetic polyneuropathy: Secondary | ICD-10-CM

## 2019-01-16 DIAGNOSIS — I10 Essential (primary) hypertension: Secondary | ICD-10-CM

## 2019-01-16 DIAGNOSIS — E10649 Type 1 diabetes mellitus with hypoglycemia without coma: Secondary | ICD-10-CM | POA: Diagnosis not present

## 2019-01-16 DIAGNOSIS — E063 Autoimmune thyroiditis: Secondary | ICD-10-CM

## 2019-01-16 DIAGNOSIS — E1065 Type 1 diabetes mellitus with hyperglycemia: Secondary | ICD-10-CM

## 2019-01-16 DIAGNOSIS — E049 Nontoxic goiter, unspecified: Secondary | ICD-10-CM | POA: Diagnosis not present

## 2019-01-16 DIAGNOSIS — L989 Disorder of the skin and subcutaneous tissue, unspecified: Secondary | ICD-10-CM

## 2019-01-16 DIAGNOSIS — K3184 Gastroparesis: Secondary | ICD-10-CM

## 2019-01-16 DIAGNOSIS — Z23 Encounter for immunization: Secondary | ICD-10-CM | POA: Diagnosis not present

## 2019-01-16 DIAGNOSIS — IMO0001 Reserved for inherently not codable concepts without codable children: Secondary | ICD-10-CM

## 2019-01-16 DIAGNOSIS — E1143 Type 2 diabetes mellitus with diabetic autonomic (poly)neuropathy: Secondary | ICD-10-CM

## 2019-01-16 DIAGNOSIS — K219 Gastro-esophageal reflux disease without esophagitis: Secondary | ICD-10-CM

## 2019-01-16 DIAGNOSIS — R000 Tachycardia, unspecified: Secondary | ICD-10-CM

## 2019-01-16 DIAGNOSIS — R1013 Epigastric pain: Secondary | ICD-10-CM

## 2019-01-16 LAB — POCT GLUCOSE (DEVICE FOR HOME USE): Glucose Fasting, POC: 117 mg/dL — AB (ref 70–99)

## 2019-01-16 LAB — POCT GLYCOSYLATED HEMOGLOBIN (HGB A1C): Hemoglobin A1C: 9.1 % — AB (ref 4.0–5.6)

## 2019-01-16 MED ORDER — LANTUS SOLOSTAR 100 UNIT/ML ~~LOC~~ SOPN: PEN_INJECTOR | SUBCUTANEOUS | 5 refills | Status: DC

## 2019-01-16 MED ORDER — RABEPRAZOLE SODIUM 20 MG PO TBEC: DELAYED_RELEASE_TABLET | ORAL | 5 refills | Status: DC

## 2019-01-16 NOTE — Progress Notes (Signed)
Subjective:  Patient Name: Rodney [name] Date of Birth: 10-24-2000  MRN: [id number]  Rodney [name]  presents to the office today for follow-up evaluation and management of his type 1 diabetes, hypoglycemia, goiter, growth delay, autonomic neuropathy, tachycardia, and non-compliance with medical treatment.  HISTORY OF PRESENT ILLNESS:   Rodney [name] is a 18 y.o. Caucasian young man.   Rodney [name] was unaccompanied.   79. Rodney [name] was almost 52 1/20 years old when he was diagnosed with T1DM in June 2005. He was admitted to [facility] and then followed at the [facility] at [facility]. We have followed him here since 02/24/05. He was then using Lantus as a basal insulin and was using Humalog lispro according to a sliding scale regimen. We arranged for additional DM education, to include carb counting, and converted him to a two-component plan for Humalog lispro in which he took both correction doses and food doses at meals, but also used a mini-sliding scale at HS and 2:00 AM. Unfortunately, as Rodney [name] and his parents were getting accustomed to his new insulin regimen, his mother tragically died suddenly of a massive MI in March 2007. Rodney [name], his fraternal twin, and his dad all took her death very hard. It took them months to recover, but dad did a wonderful job of working with Rodney [name]. In February 2011 we converted Rodney [name] to a Medtronic Paradigm Insulin pump. Although his initial HbA1c was 7.6% two months later, the A1c subsequently rose to 10.2% in January 2012 and to 12.2% in January 2014. At the time of his visit on 02/24/14, [name]'s only known microvascular complication of DM was his autonomic neuropathy with fixed tachycardia. As we discussed with Rodney [name] and his dad, these complications were entirely reversible if we could get his DM under better control. Unfortunately, Rodney [name] was admitted to [facility] for DKA due to non-compliance in January 2015 and was taken off his pump at that time. He was also readmitted  for DKA in  December 2015.   2. In 2014 and again in 2015 Rodney [name] was incarcerated. Neither Rodney [name] nor his father were willing to discuss the criminal activities that resulted in the incarcerations. Rodney [name] was released from incarceration in early 2017, but remained on probation. While he was incarcerated he was diagnosed with bipolar disorder and treated with Depakote, Abilify, and citalopram. He was subsequently re-incarcerated after his visit in May 2018. He was released from prison in early September 2019.   3. The patient's last [facility] visit was on 04/05/18. At that visit I increased his Lantus dose to 32 units.  I asked him to take omeprazole, 40 mg/day. He was supposed to call me the following Friday evening, but did not. He has not tried to call me since then. He said that he forgets. He was a No Show for appointments on 1/`17/20 and 01/11/19.  A. He has nausea and vomiting every morning for a couple of months. He has frequent acid indigestion and heartburn about every night. He sometimes also vomits later in the day. He has also had some nasal congestion and cough. .   B. He had an acute bacterial sinusitis in February. He went to the ED in March for hyperglycemia. He went to the ED in May for a closed boxer's fracture of the left 5th metacarpal. . He went back to the ED in July for a closed, displaced fracture of the left 5th metacarpal that had not fully healed.   C. He now takes 32 units of Lantus insulin.  He also takes Novolog according to his old 150/30/10 plan. He also takes the omeprazole, 40 mg/day. He usually eats only twice [name], often just soup.   D. He no longer takes Depakote or any other psychiatric medications. He no longer takes alcohol, but does occasionally take MJ. He does not use hard drugs. He still smokes and drinks coffee most mornings. He does not take NSAIDS, but does take 500-750 of Rodney twice [name].     4. Pertinent Review of Systems:  Constitutional: The patient feels  "tired", but not nauseated. His energy level is about 6-7/10. He has been feeling more paranoid lately.     Eyes: Vision is good. There are no significant eye complaints. He has not had an eye exam for several years.  Neck: The patient has no complaints of anterior neck swelling, soreness, tenderness,  pressure, discomfort, or difficulty swallowing.  Heart: Heart rate increases with exercise or other physical activity. The patient has no complaints of palpitations, irregular heat beats, chest pain, or chest pressure. Gastrointestinal: As above. Bowel movents are like little balls.  Legs: Muscle mass and strength seem normal. There are no complaints of numbness, tingling, burning, or pain. No edema is noted. Feet: His ankles always hurt. There are no obvious foot problems. There are no complaints of numbness, tingling, burning, or pain. No edema is noted.  4. BG meter: We have data for the past two weeks. He checked his BGs on 13 of the past 14 days. He checks BGs from 1-4 times per day, average 1.5 times. Average BG is 253, compared with about 300 at his last visit. BG range is 43-Hi, compared with 114-540 at his last visit.  His BGs have been very hectic, changing from the 40s-60s to the 400s-500s very rapidly. This pattern indicated that he is often missing Lantus and Novolog doses, but also that he often just guesses at his Novolog dose. He has had 4 BGs <80 in the 43-65 range. He has also had several BGs >500.   PAST MEDICAL, FAMILY, AND SOCIAL HISTORY  Past Medical History:  Diagnosis Date  . Abrasion of arm, right 08/22/2011  . Bipolar 1 disorder ([place])   . Depression   . Diabetes mellitus    Type I - insulin pump  . Diabetes mellitus type I ([place])   . Nasal fracture 08/13/2011   was hit in nose while playing basketball  . Seizures ([place])    x 3 - due to low blood sugar - last time 5-6 yrs. ago    Family History  Problem Relation Age of Onset  . Heart disease Mother        MI at age 49   . Diabetes Paternal Grandfather        type 2  . Hypertension Paternal Grandfather   . Heart disease Paternal Grandfather   . Hepatitis C Brother        older brother (incarcerated)  . Hypertension Father      Current Outpatient Medications:  .  ACCU-CHEK FASTCLIX LANCETS MISC, Check sugar 10 x [name], Disp: 306 each, Rfl: 3 .  ACCU-CHEK GUIDE test strip, USE 6X [name] TO CHECK GLUCOSE, Disp: 200 strip, Rfl: 5 .  glucagon (GLUCAGON EMERGENCY) 1 MG injection, Inject 1 mg into the vein once as needed (for hypoglycemia)., Disp: 1 each, Rfl: 12 .  insulin aspart (NOVOLOG FLEXPEN) 100 UNIT/ML FlexPen, Use up to 50 units [name]., Disp: 15 mL, Rfl: 5 .  Insulin Pen Needle (BD  PEN NEEDLE NANO U/F) 32G X 4 MM MISC, USE TO INJECT INSULIN VIA INSULIN PEN 7 TIME A DAY, Disp: 250 each, Rfl: 12 .  amoxicillin-clavulanate (AUGMENTIN) 875-125 MG tablet, Take 1 tablet by mouth every 12 (twelve) hours. (Patient not taking: Reported on 01/16/2019), Disp: 14 tablet, Rfl: 0 .  DEPAKOTE 250 MG DR tablet, Take two tablets each evening. (Patient taking differently: Take 500 mg by mouth every evening. ), Disp: 60 tablet, Rfl: 3 .  Insulin Glargine (LANTUS SOLOSTAR) 100 UNIT/ML Solostar Pen, Inject up to 50 units [name], Disp: 15 mL, Rfl: 5 .  omeprazole (PRILOSEC) 40 MG capsule, TAKE 1 CAPSULE BY MOUTH EVERY DAY (Patient not taking: Reported on 01/16/2019), Disp: 30 capsule, Rfl: 5 .  ondansetron (ZOFRAN ODT) 4 MG disintegrating tablet, Take 1 tablet (4 mg total) by mouth every 4 (four) hours as needed for nausea or vomiting. (Patient not taking: Reported on 01/16/2019), Disp: 20 tablet, Rfl: 0 .  oseltamivir (TAMIFLU) 75 MG capsule, Take 1 capsule (75 mg total) by mouth every 12 (twelve) hours. (Patient not taking: Reported on 01/16/2019), Disp: 10 capsule, Rfl: 0 .  traMADol (ULTRAM) 50 MG tablet, Take 1 tablet (50 mg total) by mouth every 6 (six) hours as needed. (Patient not taking: Reported on 01/16/2019), Disp: 15  tablet, Rfl: 0  Allergies as of 01/16/2019  . (No Known Allergies)     reports that he has been smoking cigarettes. He has been smoking about 1.00 pack per day. He has never used smokeless tobacco. He reports current alcohol use. He reports that he does not use drugs. Pediatric History  Patient Parents  . Matusevich,[name] (Father)   Other Topics Concern  . Not on file  Social History Narrative   Lives with dad and twin brother. Mom deceased Aug 06, 2005 from [place]. 8th grade at [employer]. [employer] football. Pt has not smoked cigarettes or marijuana in the last 9 months.    1. School and family: He is now living with his father. He completed his parole. 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 He wants to go back to [employer] at some time in the future. He has a baby daughter whom he co-parents.     2. Activities: He does a lot of physical work now.    3. Substances: He smokes and takes alcohol occasionally, but no illicit drugs.    4. Primary Care Provider: None  5. Health insurance: Medicaid  REVIEW OF SYSTEMS: There are no other significant problems involving Haydan's other body systems.   Objective:  Vital Signs:  BP 132/78   Pulse 100   Ht 5' 6.54" (1.69 m)   Wt 135 lb 12.8 oz (61.6 kg)   BMI 21.57 kg/m    Ht Readings from Last 3 Encounters:  01/16/19 5' 6.54" (1.69 m)  11/15/18 5\' 6"  (1.676 m)  09/26/18 5\' 8"  (1.727 m)   Wt Readings from Last 3 Encounters:  01/16/19 135 lb 12.8 oz (61.6 kg)  11/15/18 136 lb (61.7 kg)  09/26/18 145 lb (65.8 kg)   HC Readings from Last 3 Encounters:  No data found for [facility]   Body surface area is 1.7 meters squared. Facility age limit for growth percentiles is 20 years. Facility age limit for growth percentiles is 20 years.  PHYSICAL EXAM:  Constitutional: The patient appears healthy and well nourished. He sniffled and coughed several times. He was alert and oriented. His affect and insight were fairly normal today.  He has lost 10  pounds since his last visit. He was alert and bright. His affect and insight were pretty good.  Head: The head is normocephalic. Face: The face appears normal. There are no obvious dysmorphic features. Eyes: The eyes appear to be normally formed and spaced. Gaze is conjugate. There is no obvious arcus or proptosis. Moisture is normal.   Ears: The ears are normally placed and appear externally normal. Mouth: The oropharynx and tongue appear normal. Dentition appears to be normal for age. Mouth is moist. Neck: The thyroid gland is enlarged at about 23 grams in size. The left lobe is larger than the right. The consistency of the gland is fairly full. There is no tenderness to palpation.  Lungs: The lungs are clear to auscultation. Air movement is good. Heart: Heart rate and rhythm are regular. Heart sounds S1 and S2 are normal. I did not appreciate any pathologic cardiac murmurs. Abdomen: The abdomen is normal in size for the patient's age. Bowel sounds are normal. There is no obvious hepatomegaly, splenomegaly, or other mass effect. He has some mild tenderness to palpation of his epigastrium.  Arms: Muscle size and bulk are normal for age. Hands: His hand has healed. He does not have a tremor today.  Legs: Muscles appear normal for age. No edema is present.  Feet: He has an open blister of the right Achilles tendon. The lesion does not look infected. DP pulses are normal 2+.  Neurologic: Strength is normal for age in both the upper and lower extremities. Muscle tone is normal. Sensation to touch is normal in both legs, but decreased in both heels.  At the end of the exam he felt shaky. BG was 40. I gave him 40 grams of carbs and he recovered.       LAB DATA:   Results for orders placed or performed in visit on 01/16/19 (from the past 504 hour(s))  POCT Glucose (Device for Home Use)   Collection Time: 01/16/19  2:03 PM  Result Value Ref Range   Glucose Fasting, POC 117 (A) 70 - 99 mg/dL   POC  Glucose    POCT glycosylated hemoglobin (Hb A1C)   Collection Time: 01/16/19  2:08 PM  Result Value Ref Range   Hemoglobin A1C 9.1 (A) 4.0 - 5.6 %   HbA1c POC (<> result, manual entry)     HbA1c, POC (prediabetic range)     HbA1c, POC (controlled diabetic range)     Labs 01/16/19: HbA1c 9.1%, CBG 117  Labs 04/05/18: CBG 534, urine glucose positive, urine ketones small  Labs 02/20/18: HbA1c 10.4%, CBG 522; urine positive for glucose, ketones moderate. He did not come in to have lab tests done as I had requested.   Labs 02/16/18: BMP: sodium 128, potassium 4.9, chloride 92, CO2 25, glucose 775  Labs 09/23/16: HbA1c 9.4%  Labs 09/15/15: HbA1c 11.5%  Labs 09/08/15: BG 502, venous pH 7.358, glucose 556, serum CO2 23, urine glucose >1000, urine ketones 40  Labs 06/25/15: HbA1c 9.8%; TSH 1.83, free T4 1.2, free T3 3.3; cholesterol 136, triglycerides 119, HDL 48, LDL 64; urine microalbumin/creatinine ratio 3; CMP normal except for glucose 234   Labs 02/21/14: BMP was normal except glucose 191;   Labs 05/03/12: CMP was normal, except for a glucose of 327. TSH 0.955, free T3 3.6; cholesterol 149, triglycerides 135, HDL 44, LDL 78   Assessment and Plan:   ASSESSMENT:  1-3. Type 1 diabetes/hypoglycemia/non-compliance:   A. His BGs are better,  but still often much too high, much too low, and much too variable. His HbA1c reflects the average of his hectic swings of BGs, so is not as good an indicator of his DM control as one would usually expect.   B. When I see the "hectic" BG pattern that he has, I suspect that he misses more Lantus doses and Novolog doses than he recognizes. I also suspect that he sometimes just guesses at his Novolog doses. Although he is supposed to be following the Lantus-Novolog 150/30/10 plan, I doubt that he has been doing so.  4-7. Autonomic neuropathy/tachycardia/gastroparesis/GERD/dyspepsia: His inappropriate sinus tachycardia has recurred. His dyspepsia and GERD are  worse. He may have gastritis now.  8. Peripheral neuropathy: This problem has recurred.   9-10. Goiter/thyroiditis: His goiter is larger today. He was euthyroid in February 2017. He was supposed to have had labs done prior to this visit, but has not done so  11. Hypertension: His BP is higher today.   12. Bipolar disorder: He is not taking any psych meds now. He indicated that he has been feeling more paranoid lately. Unfortunately, he is not interested in further psychiatric follow up or in taking any psych medications. 13. Skin lesion: He needs to keep the area clean and protected.   PLAN:  1. Diagnostic: Reviewed BG meter. Obtain fasting surveillance labs. Call Dr. Tobe Sos next Monday evening.  2. Therapeutic: Continue the Lantus dose of 32 units. Continue the current Novolog plan. Discontinue omeprazole, 40 mg/day. Start rabeprazole, 40 mg/day. Keep the skin lesion clean. Use a topical antibiotic [name]. Keep the area protected.  3. Patient education: Discussed his BG pattern and the need to take his insulins more consistently.  4. Follow-up: 1 month   Level of Service: This visit lasted 60 minutes. More than 50% of the visit was devoted to counseling.  Tillman Sers, MD, CDE Adult and Pediatric Endocrinology

## 2019-01-16 NOTE — Patient Instructions (Signed)
Follow up visit in one month. Please call Dr. Tobe Sos next Monday evening between 8:00-9:30PM.

## 2019-01-17 LAB — COMPREHENSIVE METABOLIC PANEL
AG Ratio: 1.3 (calc) (ref 1.0–2.5)
ALT: 15 U/L (ref 9–46)
AST: 18 U/L (ref 10–40)
Albumin: 4.3 g/dL (ref 3.6–5.1)
Alkaline phosphatase (APISO): 71 U/L (ref 36–130)
BUN: 10 mg/dL (ref 7–25)
CO2: 30 mmol/L (ref 20–32)
Calcium: 10.6 mg/dL — ABNORMAL HIGH (ref 8.6–10.3)
Chloride: 99 mmol/L (ref 98–110)
Creat: 1.08 mg/dL (ref 0.60–1.35)
Globulin: 3.2 g/dL (calc) (ref 1.9–3.7)
Glucose, Bld: 132 mg/dL (ref 65–139)
Potassium: 4 mmol/L (ref 3.5–5.3)
Sodium: 138 mmol/L (ref 135–146)
Total Bilirubin: 0.4 mg/dL (ref 0.2–1.2)
Total Protein: 7.5 g/dL (ref 6.1–8.1)

## 2019-01-17 LAB — T3, FREE: T3, Free: 3.8 pg/mL (ref 3.0–4.7)

## 2019-01-17 LAB — TSH: TSH: 0.82 mIU/L (ref 0.40–4.50)

## 2019-01-17 LAB — T4, FREE: Free T4: 1.1 ng/dL (ref 0.8–1.4)

## 2019-01-17 LAB — LIPID PANEL
Cholesterol: 127 mg/dL (ref <200)
HDL: 42 mg/dL (ref >=40)
LDL Cholesterol (Calc): 64 mg/dL (calc)
Non-HDL Cholesterol (Calc): 85 mg/dL (calc) (ref <130)
Total CHOL/HDL Ratio: 3 (calc) (ref <5.0)
Triglycerides: 128 mg/dL (ref <150)

## 2019-01-17 LAB — MICROALBUMIN / CREATININE URINE RATIO
Creatinine, Urine: 69 mg/dL (ref 20–320)
Microalb Creat Ratio: 9 mcg/mg creat (ref <30)
Microalb, Ur: 0.6 mg/dL

## 2019-01-24 ENCOUNTER — Telehealth (INDEPENDENT_AMBULATORY_CARE_PROVIDER_SITE_OTHER): Payer: Self-pay

## 2019-01-24 NOTE — Telephone Encounter (Signed)
Received indication that Rabeprazole Sodium 20mg  DR tablets needed to be prior authorized. PA initiated through [facility]  PA# [id number] Status:APPROVED

## 2019-01-28 ENCOUNTER — Encounter (INDEPENDENT_AMBULATORY_CARE_PROVIDER_SITE_OTHER): Payer: Self-pay | Admitting: *Deleted

## 2019-01-29 ENCOUNTER — Other Ambulatory Visit (INDEPENDENT_AMBULATORY_CARE_PROVIDER_SITE_OTHER): Payer: Self-pay | Admitting: "Endocrinology

## 2019-02-28 ENCOUNTER — Ambulatory Visit (INDEPENDENT_AMBULATORY_CARE_PROVIDER_SITE_OTHER): Payer: Medicaid Other | Admitting: "Endocrinology

## 2019-02-28 ENCOUNTER — Encounter (INDEPENDENT_AMBULATORY_CARE_PROVIDER_SITE_OTHER): Payer: Self-pay | Admitting: "Endocrinology

## 2019-02-28 ENCOUNTER — Other Ambulatory Visit: Payer: Self-pay

## 2019-02-28 VITALS — BP 116/78 | HR 74 | Ht 67.13 in | Wt 138.0 lb

## 2019-02-28 DIAGNOSIS — R000 Tachycardia, unspecified: Secondary | ICD-10-CM

## 2019-02-28 DIAGNOSIS — E1042 Type 1 diabetes mellitus with diabetic polyneuropathy: Secondary | ICD-10-CM | POA: Diagnosis not present

## 2019-02-28 DIAGNOSIS — E1043 Type 1 diabetes mellitus with diabetic autonomic (poly)neuropathy: Secondary | ICD-10-CM

## 2019-02-28 DIAGNOSIS — E049 Nontoxic goiter, unspecified: Secondary | ICD-10-CM

## 2019-02-28 DIAGNOSIS — E1010 Type 1 diabetes mellitus with ketoacidosis without coma: Secondary | ICD-10-CM | POA: Diagnosis not present

## 2019-02-28 DIAGNOSIS — E10649 Type 1 diabetes mellitus with hypoglycemia without coma: Secondary | ICD-10-CM

## 2019-02-28 DIAGNOSIS — I10 Essential (primary) hypertension: Secondary | ICD-10-CM

## 2019-02-28 DIAGNOSIS — E063 Autoimmune thyroiditis: Secondary | ICD-10-CM

## 2019-02-28 DIAGNOSIS — K219 Gastro-esophageal reflux disease without esophagitis: Secondary | ICD-10-CM

## 2019-02-28 LAB — POCT GLUCOSE (DEVICE FOR HOME USE): POC Glucose: 414 mg/dl — AB (ref 70–99)

## 2019-02-28 LAB — POCT URINALYSIS DIPSTICK: Ketones, UA: NEGATIVE

## 2019-02-28 NOTE — Patient Instructions (Signed)
Follow up visit in one month.  

## 2019-02-28 NOTE — Progress Notes (Signed)
Subjective:  Patient Name: Rodney [name] Date of Birth: 2001-04-08  MRN: [id number]  Rodney [name]  presents to the office today for follow-up evaluation and management of his type 1 diabetes, hypoglycemia, goiter, growth delay, autonomic neuropathy, tachycardia, nausea and vomiting, GERD/heartburn, dyspepsia, and non-compliance with medical treatment.  HISTORY OF PRESENT ILLNESS:   Rodney [name] "Rodney [name]" Rodney [name] is a 18 y.o. Caucasian young man.   Rodney [name] was unaccompanied.   89. Rodney [name] was almost 8 1/20 years old when he was diagnosed with T1DM in June 2005. He was admitted to [facility] and then followed at the Pediatric [facility] at [facility]. We have followed him here since 02/24/05. He was then using Lantus as a basal insulin and was using Humalog lispro according to a sliding scale regimen. We arranged for additional DM education, to include carb counting, and converted him to a two-component plan for Humalog lispro in which he took both correction doses and food doses at meals, but also used a mini-sliding scale at HS and 2:00 AM. Unfortunately, as Rodney [name] and his parents were getting accustomed to his new insulin regimen, his mother tragically died suddenly of a massive MI in March 2007. Rodney [name], his fraternal twin, and his dad all took her death very hard. It took them months to recover, but dad did a wonderful job of working with Rodney [name]. In February 2011 we converted Rodney [name] to a Medtronic Paradigm Insulin pump. Although his initial HbA1c was 7.6% two months later, the A1c subsequently rose to 10.2% in January 2012 and to 12.2% in January 2014. At the time of his visit on 02/24/14, Rodney [name]'s only known microvascular complication of DM was his autonomic neuropathy with fixed tachycardia. As we discussed with Rodney [name] and his dad, these complications were entirely reversible if we could get his DM under better control. Unfortunately, Rodney [name] was admitted to [facility] for DKA due to non-compliance in January 2015 and was taken off  his pump at that time. He was also readmitted for DKA in  December 2015.   2. In 2014 and again in 2015 Rodney [name] was incarcerated. Neither Rodney [name] nor his father were willing to discuss the criminal activities that resulted in the incarcerations. Rodney [name] was released from incarceration in early 2017, but remained on probation. While he was incarcerated he was diagnosed with bipolar disorder and treated with Depakote, Abilify, and citalopram. He was subsequently re-incarcerated after his visit in May 2018. He was released from prison in early September 2019.   3. The patient's last [facility] visit was on 01/16/19. At that visit I continued his Lantus dose of 32 units.  I discontinued his omeprazole and started him on rabeprazole, 40 mg/day. I continued his Novolog plan.   A. In the interim, he has been healthy.   B. Since converting from omeprazole to rabeprazole, Rodney [name] has not been having nausea and vomiting, but still has bad heartburn at night. He has also had some nasal congestion and cough.   C. He has had more pains in his ankles, but he does not have swelling. He has also had more low back pains.   D. He now takes 32 units of Lantus insulin. He is supposed to take Novolog according to his old 150/30/10 plan, but is not consistent in following that plan. He also takes the rabeprazole, 40 mg/day. He usually eats only twice daily, often just soup. Dinner is usually about 5-6 PM.   E. He no longer takes Depakote or any other psychiatric medications. He no longer takes alcohol,  but does occasionally take MJ. He does not use hard drugs. He still smokes and drinks coffee most mornings. He does not take NSAIDS, but does take 500-750 of Tylenol twice daily.     4. Pertinent Review of Systems:  Constitutional: The patient feels "tired all the time". His energy level is about 7/10. He still sometimes feels paranoid.     Eyes: Vision is not good at distance. There are no other significant eye complaints. He has not had  an eye exam for several years. I recommended [facility] to him today. Neck: The patient has no complaints of anterior neck swelling, soreness, tenderness,  pressure, discomfort, or difficulty swallowing.  Heart: Heart rate increases with exercise or other physical activity. The patient has no complaints of palpitations, irregular heat beats, chest pain, or chest pressure. Gastrointestinal: As above. Bowel movents are like little balls.  Legs: Muscle mass and strength seem normal. There are no complaints of numbness, tingling, burning, or pain. No edema is noted. Feet: His ankles always hurt. There are no obvious foot problems. There are no complaints of numbness, tingling, burning, or pain. No edema is noted.  4. BG meter: We have data for the past four weeks. He checked his BGs on 24 of the past 28 days. He checked BGs from 0-2 times per day, average 1 time. Average BG is 243, compared with 253 at his last visit and with 300 at his prior visit. BG range is 34-556, compared with 43-Hi at his last visit and with 114-540 at his prior visit.  His BGs have again been very hectic, changing from the 30s-60s to the 400s-500s very rapidly. This pattern indicated that he is often missing Lantus and Novolog doses, but also that he often just guesses at his Novolog dose. He has had 6 BGs <80 in the 30s-50s range. He has also had 4 BGs in the 400s, 2 BGs in the 500s, and one Hi (>600).   PAST MEDICAL, FAMILY, AND SOCIAL HISTORY  Past Medical History:  Diagnosis Date  . Abrasion of arm, right 08/22/2011  . Bipolar 1 disorder ([place])   . Depression   . Diabetes mellitus    Type I - insulin pump  . Diabetes mellitus type I ([place])   . Nasal fracture 08/13/2011   was hit in nose while playing basketball  . Seizures ([place])    x 3 - due to low blood sugar - last time 5-6 yrs. ago    Family History  Problem Relation Age of Onset  . Heart disease Mother        MI at age 85  . Diabetes Paternal  Grandfather        type 2  . Hypertension Paternal Grandfather   . Heart disease Paternal Grandfather   . Hepatitis C Brother        older brother (incarcerated)  . Hypertension Father      Current Outpatient Medications:  .  ACCU-CHEK GUIDE test strip, USE 6X DAILY TO CHECK GLUCOSE, Disp: 200 strip, Rfl: 5 .  glucagon (GLUCAGON EMERGENCY) 1 MG injection, Inject 1 mg into the vein once as needed (for hypoglycemia)., Disp: 1 each, Rfl: 12 .  Insulin Glargine (LANTUS SOLOSTAR) 100 UNIT/ML Solostar Pen, Inject up to 50 units daily, Disp: 15 mL, Rfl: 5 .  Insulin Pen Needle (BD PEN NEEDLE NANO U/F) 32G X 4 MM MISC, USE TO INJECT INSULIN VIA INSULIN PEN 7 TIME A DAY, Disp: 250 each, Rfl: 12 .  NOVOLOG FLEXPEN 100 UNIT/ML FlexPen, USE UP TO 50 UNITS DAILY., Disp: 15 mL, Rfl: 5 .  omeprazole (PRILOSEC) 40 MG capsule, TAKE 1 CAPSULE BY MOUTH EVERY DAY, Disp: 30 capsule, Rfl: 5 .  DEPAKOTE 250 MG DR tablet, Take two tablets each evening. (Patient taking differently: Take 500 mg by mouth every evening. ), Disp: 60 tablet, Rfl: 3 .  ondansetron (ZOFRAN ODT) 4 MG disintegrating tablet, Take 1 tablet (4 mg total) by mouth every 4 (four) hours as needed for nausea or vomiting. (Patient not taking: Reported on 01/16/2019), Disp: 20 tablet, Rfl: 0 .  RABEprazole (ACIPHEX) 20 MG tablet, Take one tablet twice daily., Disp: 60 tablet, Rfl: 5 .  traMADol (ULTRAM) 50 MG tablet, Take 1 tablet (50 mg total) by mouth every 6 (six) hours as needed. (Patient not taking: Reported on 01/16/2019), Disp: 15 tablet, Rfl: 0  Allergies as of 02/28/2019  . (No Known Allergies)     reports that he has been smoking cigarettes. He has been smoking about 1.00 pack per day. He has never used smokeless tobacco. He reports current alcohol use. He reports that he does not use drugs. Pediatric History  Patient Parents  . Matusevich,[name] (Father)   Other Topics Concern  . Not on file  Social History Narrative   Lives with dad  and twin brother. Mom deceased 07-31-05 from [place]. 8th grade at [employer]. [place] [employer]. Pt has not smoked cigarettes or marijuana in the last 9 months.    1. School and family: He is still living with his father. He completed his parole. 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 He wants to go back to [employer] at some time in the future. He has a baby daughter whom he co-parents.     2. Activities: He does a lot of physical work now.    3. Substances: He smokes and takes alcohol occasionally, but no illicit drugs.    4. Primary Care Provider: None  5. Health insurance: Medicaid  REVIEW OF SYSTEMS: There are no other significant problems involving Lemuel's other body systems.   Objective:  Vital Signs:  BP 116/78   Pulse 74   Ht 5' 7.13" (1.705 m)   Wt 138 lb (62.6 kg)   BMI 21.53 kg/m    Ht Readings from Last 3 Encounters:  02/28/19 5' 7.13" (1.705 m)  01/16/19 5' 6.54" (1.69 m)  11/15/18 5\' 6"  (1.676 m)   Wt Readings from Last 3 Encounters:  02/28/19 138 lb (62.6 kg)  01/16/19 135 lb 12.8 oz (61.6 kg)  11/15/18 136 lb (61.7 kg)   HC Readings from Last 3 Encounters:  No data found for [facility]   Body surface area is 1.72 meters squared. Facility age limit for growth percentiles is 20 years. Facility age limit for growth percentiles is 20 years.  PHYSICAL EXAM:  Constitutional: The patient appears healthy and well nourished. He is alert and oriented. His affect is a bit flat today. His insight is fairly normal today. He has re-gained 2 pounds since his last visit.  Head: The head is normocephalic. Face: The face appears normal. There are no obvious dysmorphic features. Eyes: The eyes appear to be normally formed and spaced. Gaze is conjugate. There is no obvious arcus or proptosis. Moisture is normal.   Ears: The ears are normally placed and appear externally normal. Mouth: The oropharynx and tongue appear normal. Dentition appears to be normal for age. Mouth is  moist.  Neck: The thyroid gland is enlarged, but smaller, at about 21-22 grams in size. The consistency of the gland is somewhat full. There is mild tenderness to palpation bilaterally.  Lungs: The lungs are clear to auscultation. Air movement is good. Heart: Heart rate and rhythm are regular. Heart sounds S1 and S2 are normal. I did not appreciate any pathologic cardiac murmurs. Abdomen: The abdomen is normal in size for the patient's age. Bowel sounds are normal. There is no obvious hepatomegaly, splenomegaly, or other mass effect. He has no tenderness to palpation of his epigastrium.  Arms: Muscle size and bulk are normal for age. Hands: His hand has healed. He does not have a tremor today.  Legs: Muscles appear normal for age. No edema is present.  Feet: The open blister of the right Achilles tendon has healed. The ankles are not swollen. The right ankle is tender in the medial ligaments. The left ankle is tender in the lateral ligaments. DP pulses are normal 2+ on the right and 1+ on the left. .  Neurologic: Strength is normal for age in both the upper and lower extremities. Muscle tone is normal. Sensation to touch is normal in both legs, but decreased in his right heel.       LAB DATA:   Results for orders placed or performed in visit on 02/28/19 (from the past 504 hour(s))  POCT Glucose (Device for Home Use)   Collection Time: 02/28/19  2:35 PM  Result Value Ref Range   Glucose Fasting, POC     POC Glucose 414 (A) 70 - 99 mg/dl  POCT Urinalysis Dipstick   Collection Time: 02/28/19  2:41 PM  Result Value Ref Range   Color, UA     Clarity, UA     Glucose, UA     Bilirubin, UA     Ketones, UA negative    Spec Grav, UA     Blood, UA     pH, UA     Protein, UA     Urobilinogen, UA     Nitrite, UA     Leukocytes, UA     Appearance     Odor     Labs 02/28/19: CBG 414; UA negative for ketones  Labs 01/16/19: HbA1c 9.1%, CBG 117; TSH 0.82, free T4 1.1, free T3 3.8; CMP normal  except calcium 10.6 (ref 8.6-10.3) {We have been seeing many calcium levels in 10.5-10.7 range recently that appear to be artifactually high.]; cholesterol 127, triglycerides 128, HDL 42, LDL 64; urine microalbumin/creatinine ratio 9  Labs 04/05/18: CBG 534, urine glucose positive, urine ketones small  Labs 02/20/18: HbA1c 10.4%, CBG 522; urine positive for glucose, ketones moderate. He did not come in to have lab tests done as I had requested.   Labs 02/16/18: BMP: sodium 128, potassium 4.9, chloride 92, CO2 25, glucose 775  Labs 09/23/16: HbA1c 9.4%  Labs 09/15/15: HbA1c 11.5%  Labs 09/08/15: BG 502, venous pH 7.358, glucose 556, serum CO2 23, urine glucose >1000, urine ketones 40  Labs 06/25/15: HbA1c 9.8%; TSH 1.83, free T4 1.2, free T3 3.3; cholesterol 136, triglycerides 119, HDL 48, LDL 64; urine microalbumin/creatinine ratio 3; CMP normal except for glucose 234   Labs 02/21/14: BMP was normal except glucose 191;   Labs 05/03/12: CMP was normal, except for a glucose of 327. TSH 0.955, free T3 3.6; cholesterol 149, triglycerides 135, HDL 44, LDL 78   Assessment and Plan:   ASSESSMENT:  1-3. Type 1 diabetes/hypoglycemia/non-compliance:   A.  His BGs are still very hectic, in part due to checking BGs only once a day, in part due to missing doses of Lantus and Novolog, and in part due to just guessing at how much insulin he needs.   B. Fortunately, his serum creatinine, serum LFTs, and urine protein are normal.   4-7. Autonomic neuropathy/tachycardia/gastroparesis/GERD/dyspepsia: His inappropriate sinus tachycardia has improved. His nausea and vomiting resolved, but he thinks his GERD is worse. He does not appear to have gastritis now.  8. Peripheral neuropathy: This problem has improved.   9-10. Goiter/thyroiditis: His goiter is smaller today.The waxing and waning of thyroid gland size is c/w evolving Hashimoto's thyroiditis. He was euthyroid in February 2017 and again in September 2020.   11. Hypertension: His BP is lower today.   12. Bipolar disorder: He is not taking any psych meds now. He indicated that he has been feeling more paranoid lately. Unfortunately, he is not interested in further psychiatric follow up or in taking any psych medications. 13. Skin lesion: Resolved  14. Strain of the ankle ligaments: He appears to be inadvertently overstretching his left lateral and right medial ligaments at times.   PLAN:  1. Diagnostic: Reviewed BG meter. Check BGs 4 times daily.  2. Therapeutic: Continue the Lantus dose of 32 units. Continue the current Novolog plan. Continue rabeprazole, 40 mg/day.  3. Patient education: Discussed his BG pattern and the need to take his insulins more consistently.  4. Follow-up: 1 month   Level of Service: This visit lasted 65 minutes. More than 50% of the visit was devoted to counseling.  Tillman Sers, MD, CDE Adult and Pediatric Endocrinology

## 2019-04-08 ENCOUNTER — Ambulatory Visit (INDEPENDENT_AMBULATORY_CARE_PROVIDER_SITE_OTHER): Payer: Medicaid Other | Admitting: "Endocrinology

## 2019-04-16 ENCOUNTER — Other Ambulatory Visit (INDEPENDENT_AMBULATORY_CARE_PROVIDER_SITE_OTHER): Payer: Self-pay | Admitting: "Endocrinology

## 2019-05-30 ENCOUNTER — Ambulatory Visit (INDEPENDENT_AMBULATORY_CARE_PROVIDER_SITE_OTHER): Payer: Medicaid Other | Admitting: "Endocrinology

## 2019-05-30 NOTE — Progress Notes (Signed)
Subjective:  Patient was a No Show.

## 2019-05-31 NOTE — Patient Instructions (Signed)
Patient was a No show

## 2019-06-05 ENCOUNTER — Other Ambulatory Visit: Payer: Self-pay

## 2019-06-05 ENCOUNTER — Emergency Department (HOSPITAL_BASED_OUTPATIENT_CLINIC_OR_DEPARTMENT_OTHER): Payer: Medicaid Other

## 2019-06-05 ENCOUNTER — Encounter (HOSPITAL_BASED_OUTPATIENT_CLINIC_OR_DEPARTMENT_OTHER): Payer: Self-pay | Admitting: *Deleted

## 2019-06-05 ENCOUNTER — Emergency Department (HOSPITAL_BASED_OUTPATIENT_CLINIC_OR_DEPARTMENT_OTHER)
Admission: EM | Admit: 2019-06-05 | Discharge: 2019-06-05 | Disposition: A | Discharge status: Home / Self Care | Payer: Medicaid Other | Attending: Emergency Medicine | Admitting: Emergency Medicine

## 2019-06-05 DIAGNOSIS — N50812 Left testicular pain: Secondary | ICD-10-CM | POA: Diagnosis present

## 2019-06-05 DIAGNOSIS — N451 Epididymitis: Secondary | ICD-10-CM | POA: Diagnosis not present

## 2019-06-05 DIAGNOSIS — Z79899 Other long term (current) drug therapy: Secondary | ICD-10-CM | POA: Diagnosis not present

## 2019-06-05 DIAGNOSIS — E109 Type 1 diabetes mellitus without complications: Secondary | ICD-10-CM | POA: Diagnosis not present

## 2019-06-05 DIAGNOSIS — F17210 Nicotine dependence, cigarettes, uncomplicated: Secondary | ICD-10-CM | POA: Insufficient documentation

## 2019-06-05 LAB — URINALYSIS, MICROSCOPIC (REFLEX): WBC, UA: NONE SEEN WBC/hpf (ref 0–5)

## 2019-06-05 LAB — URINALYSIS, ROUTINE W REFLEX MICROSCOPIC
Bilirubin Urine: NEGATIVE
Glucose, UA: >=500 mg/dL — AB
Hgb urine dipstick: NEGATIVE
Ketones, ur: NEGATIVE mg/dL
Leukocytes,Ua: NEGATIVE
Nitrite: NEGATIVE
Protein, ur: NEGATIVE mg/dL
Specific Gravity, Urine: 1.025 (ref 1.005–1.030)
pH: 6 (ref 5.0–8.0)

## 2019-06-05 MED ORDER — CEFTRIAXONE SODIUM 500 MG IJ SOLR
INTRAMUSCULAR | Status: AC
  Filled 2019-06-05: qty 500

## 2019-06-05 MED ORDER — DOXYCYCLINE HYCLATE 100 MG PO CAPS: 100.0000 mg | ORAL_CAPSULE | Freq: Two times a day (BID) | ORAL | 0 refills | Status: DC

## 2019-06-05 MED ORDER — CEFTRIAXONE SODIUM 500 MG IJ SOLR
250.0000 mg | Freq: Once | INTRAMUSCULAR | Status: AC
  Administered 2019-06-05: 250 mg via INTRAMUSCULAR

## 2019-06-05 NOTE — Discharge Instructions (Signed)
Recommend taking antibiotic as prescribed for your possible infection.  For pain recommend Tylenol, Motrin.  Return to ER if you develop worsening pain, swelling, other new concerning symptom.

## 2019-06-05 NOTE — ED Triage Notes (Signed)
C/o left testicle pain and swelling x 2 days , denies injury

## 2019-06-05 NOTE — ED Provider Notes (Signed)
[facility] EMERGENCY DEPARTMENT Provider Note   CSN: [id number] Arrival date & time: 06/05/19  1833     History Chief Complaint  Patient presents with  . Testicle Pain    Rodney [name] is a 19 y.o. male.  Presenting to ER with complaints of left testicle pain.  Symptoms started around 2 days ago, slowly worsening, not sudden onset.  Pain moderate in severity, stabbing, does not radiate.  Also has noted mild amount of swelling.  No known injuries, is sexually active.  No abdominal pain.  No dysuria.  Type 1 diabetes.  HPI     Past Medical History:  Diagnosis Date  . Abrasion of arm, right 08/22/2011  . Bipolar 1 disorder ([place])   . Depression   . Diabetes mellitus    Type I - insulin pump  . Diabetes mellitus type I ([place])   . Nasal fracture 08/13/2011   was hit in nose while playing basketball  . Seizures ([place])    x 3 - due to low blood sugar - last time 5-6 yrs. ago    Patient Active Problem List   Diagnosis Date Noted  . 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 GERD (gastroesophageal reflux disease) 09/15/2015  . Unintentional weight loss 09/15/2015  . Injury of right hand 07/28/2015  . Diabetic ketoacidosis without coma associated with type 1 diabetes mellitus ([place])   . Ketonuria 04/17/2014  . Poorly controlled type 1 diabetes mellitus ([place]) 04/17/2014  . Hyperglycemia due to type 1 diabetes mellitus ([place]) 03/18/2014  . Diabetic ketoacidosis ([place]) 05/20/2013  . Dehydration 05/06/2013  . Non compliance with medical treatment 05/06/2013  . DKA (diabetic ketoacidoses) ([place]) 05/05/2013  . Diabetes ([place]) 03/25/2013  . Mild Diabetic ketoacidosis 03/25/2013  . Nausea with vomiting 03/25/2013  . Adjustment reaction 12/05/2012  . DKA (diabetic ketoacidosis) ([place]) 12/04/2012  . DKA, type 1 ([place]) 12/04/2012  . Non compliance w medication regimen 11/21/2012  . Diabetic peripheral neuropathy associated with  type 1 diabetes mellitus ([place]) 08/02/2012  . 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 Fracture, nasal 08/24/2011    Class: Acute  . Goiter   . Hypoglycemia associated with diabetes ([place]) 05/23/2011  . 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 Diabetic autonomic neuropathy ([place]) 05/23/2011  . Tachycardia 05/23/2011  . Type I (juvenile type) diabetes mellitus without mention of complication, uncontrolled 08/30/2010  . 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 APPENDECTOMY    . CLOSED REDUCTION NASAL FRACTURE  08/24/2011   Procedure: CLOSED REDUCTION NASAL FRACTURE;  Surgeon: [name] Belfast, MD;  Location: [facility];  Service: ENT;  Laterality: N/A;       Family History  Problem Relation Age of Onset  . Heart disease Mother        MI at age 43  . Diabetes Paternal Grandfather        type 2  . Hypertension Paternal Grandfather   . Heart disease Paternal Grandfather   . Hepatitis C Brother        older brother (incarcerated)  . Hypertension Father     Social History   Tobacco Use  . Smoking status: Current Every Day Smoker    Packs/day: 1.00    Types: Cigarettes  . Smokeless tobacco: Never Used  Substance Use Topics  . Alcohol use: Yes    Comment: social  . Drug use: Yes  Types: Marijuana    Home Medications Prior to Admission medications   Medication Sig Start Date End Date Taking? Authorizing Provider  ACCU-CHEK GUIDE test strip USE 6X DAILY TO CHECK GLUCOSE 04/16/19   Sherrlyn Hock, MD  DEPAKOTE 250 MG DR tablet Take two tablets each evening. Patient taking differently: Take 500 mg by mouth every evening.  09/10/15 09/09/16  Sherrlyn Hock, MD  doxycycline (VIBRAMYCIN) 100 MG capsule Take 1 capsule (100 mg total) by mouth 2 (two) times daily. 06/05/19   Lucrezia Starch, MD  glucagon (GLUCAGON EMERGENCY) 1 MG injection Inject 1 mg into the vein once as needed  (for hypoglycemia). 02/20/18   Sherrlyn Hock, MD  Insulin Glargine (LANTUS SOLOSTAR) 100 UNIT/ML Solostar Pen Inject up to 50 units daily 01/16/19   Sherrlyn Hock, MD  Insulin Pen Needle (BD PEN NEEDLE NANO U/F) 32G X 4 MM MISC USE TO INJECT INSULIN VIA INSULIN PEN 7 TIME A DAY 05/28/18   Sherrlyn Hock, MD  NOVOLOG FLEXPEN 100 UNIT/ML FlexPen USE UP TO 50 UNITS DAILY. 01/29/19   Sherrlyn Hock, MD  omeprazole (PRILOSEC) 40 MG capsule TAKE 1 CAPSULE BY MOUTH EVERY DAY 12/17/18   Sherrlyn Hock, MD  ondansetron (ZOFRAN ODT) 4 MG disintegrating tablet Take 1 tablet (4 mg total) by mouth every 4 (four) hours as needed for nausea or vomiting. Patient not taking: Reported on 01/16/2019 07/09/18   Charlesetta Shanks, MD  RABEprazole (ACIPHEX) 20 MG tablet Take one tablet twice daily. 01/16/19   Sherrlyn Hock, MD  traMADol (ULTRAM) 50 MG tablet Take 1 tablet (50 mg total) by mouth every 6 (six) hours as needed. Patient not taking: Reported on 01/16/2019 09/26/18   Carmin Muskrat, MD    Allergies    Patient has no known allergies.  Review of Systems   Review of Systems  Constitutional: Negative for chills and fever.  HENT: Negative for ear pain and sore throat.   Eyes: Negative for pain and visual disturbance.  Respiratory: Negative for cough and shortness of breath.   Cardiovascular: Negative for chest pain and palpitations.  Gastrointestinal: Negative for abdominal pain and vomiting.  Genitourinary: Positive for scrotal swelling and testicular pain. Negative for dysuria and hematuria.  Musculoskeletal: Negative for arthralgias and back pain.  Skin: Negative for color change and rash.  Neurological: Negative for seizures and syncope.  All other systems reviewed and are negative.   Physical Exam Updated Vital Signs BP 121/67 (BP Location: Right Arm)   Pulse 78   Temp 98.6 F (37 C) (Oral)   Resp 15   Ht 5\' 6"  (1.676 m)   Wt 59 kg   SpO2 98%   BMI 20.98 kg/m    Physical Exam Vitals and nursing note reviewed.  Constitutional:      Appearance: He is well-developed.  HENT:     Head: Normocephalic and atraumatic.  Eyes:     Conjunctiva/sclera: Conjunctivae normal.  Cardiovascular:     Rate and Rhythm: Normal rate and regular rhythm.     Heart sounds: No murmur.  Pulmonary:     Effort: Pulmonary effort is normal. No respiratory distress.     Breath sounds: Normal breath sounds.  Abdominal:     Palpations: Abdomen is soft.     Tenderness: There is no abdominal tenderness.  Genitourinary:    Comments: Normal-appearing penis, cremasteric reflex intact bilateral, right testicle is normal in appearance, nontender, left testicle has mild tenderness, otherwise normal in  appearance, no swelling, no erythema noted, normal lie, no inguinal hernia bilaterally Musculoskeletal:     Cervical back: Neck supple.  Skin:    General: Skin is warm and dry.  Neurological:     General: No focal deficit present.     Mental Status: He is alert.   RN chaperoned GU exam  ED Results / Procedures / Treatments   Labs (all labs ordered are listed, but only abnormal results are displayed) Labs Reviewed  URINALYSIS, ROUTINE W REFLEX MICROSCOPIC - Abnormal; Notable for the following components:      Result Value   Glucose, UA >=500 (*)    All other components within normal limits  URINALYSIS, MICROSCOPIC (REFLEX) - Abnormal; Notable for the following components:   Bacteria, UA RARE (*)    All other components within normal limits  URINE CULTURE  GC/CHLAMYDIA PROBE AMP ([place]) NOT AT [facility]    EKG None  Radiology US SCROTUM W/DOPPLER  Result Date: 06/05/2019 CLINICAL DATA:  19 year old male with left scrotal swelling and redness. EXAM: SCROTAL ULTRASOUND DOPPLER ULTRASOUND OF THE TESTICLES TECHNIQUE: Complete ultrasound examination of the testicles, epididymis, and other scrotal structures was performed. Color and spectral Doppler ultrasound were also  utilized to evaluate blood flow to the testicles. COMPARISON:  None. FINDINGS: Right testicle Measurements: 4.5 x 2.3 x 2.8 cm. No mass or microlithiasis visualized. Left testicle Measurements: 4.3 x 2.2 x 2.6 cm. No mass or microlithiasis visualized. Right epididymis: Normal in size and appearance. There is a 2 mm right epididymal head cyst. Left epididymis: Slight heterogeneity of the left epididymis. Correlation with urinalysis recommended to exclude epididymitis. Hydrocele:  Small bilateral hydroceles. Varicocele:  None visualized. Pulsed Doppler interrogation of both testes demonstrates normal low resistance arterial and venous waveforms bilaterally. IMPRESSION: 1. Unremarkable testicles. 2. Slight heterogeneity of the left epididymis. Clinical correlation is recommended to evaluate for epidermidis. Electronically Signed   By: Anner Crete M.D.   On: 06/05/2019 20:11    Procedures Procedures (including critical care time)  Medications Ordered in ED Medications  cefTRIAXone (ROCEPHIN) 500 MG injection (has no administration in time range)  cefTRIAXone (ROCEPHIN) injection 250 mg (250 mg Intramuscular Given 06/05/19 2042)    ED Course  I have reviewed the triage vital signs and the nursing notes.  Pertinent labs & imaging results that were available during my care of the patient were reviewed by me and considered in my medical decision making (see chart for details).    MDM Rules/Calculators/A&P                      19 year old male with left testicle pain.  On exam patient is well-appearing, no abdominal pain, testicles appear normal.  Scrotal ultrasound negative for torsion, possible epididymitis.  Patient sexually active, will test for gonorrhea, chlamydia, treat empirically for possible epididymitis and STDs.    After the discussed management above, the patient was determined to be safe for discharge.  The patient was in agreement with this plan and all questions regarding their  care were answered.  ED return precautions were discussed and the patient will return to the ED with any significant worsening of condition.    Final Clinical Impression(s) / ED Diagnoses Final diagnoses:  Epididymitis    Rx / DC Orders ED Discharge Orders         Ordered    doxycycline (VIBRAMYCIN) 100 MG capsule  2 times daily     06/05/19 2038  Lucrezia Starch, MD 06/06/19 [phone]

## 2019-06-06 LAB — URINE CULTURE: Culture: NO GROWTH

## 2019-06-07 LAB — GC/CHLAMYDIA PROBE AMP (~~LOC~~) NOT AT ARMC
Chlamydia: NEGATIVE
Neisseria Gonorrhea: NEGATIVE

## 2019-08-20 ENCOUNTER — Telehealth (INDEPENDENT_AMBULATORY_CARE_PROVIDER_SITE_OTHER): Payer: Self-pay

## 2019-08-20 ENCOUNTER — Other Ambulatory Visit (INDEPENDENT_AMBULATORY_CARE_PROVIDER_SITE_OTHER): Payer: Self-pay | Admitting: "Endocrinology

## 2019-08-20 NOTE — Telephone Encounter (Signed)
Patient needs a first available appointment scheduled with Dr. Tobe Sos.

## 2019-09-09 ENCOUNTER — Other Ambulatory Visit: Payer: Self-pay | Admitting: "Endocrinology

## 2019-09-09 DIAGNOSIS — E1065 Type 1 diabetes mellitus with hyperglycemia: Secondary | ICD-10-CM

## 2019-09-11 NOTE — Telephone Encounter (Signed)
Attempted to call patient to schedule follow up but phone number is not correct.

## 2019-09-12 ENCOUNTER — Encounter (INDEPENDENT_AMBULATORY_CARE_PROVIDER_SITE_OTHER): Payer: Self-pay | Admitting: "Endocrinology

## 2019-09-12 NOTE — Telephone Encounter (Signed)
Letter mailed to patient to schedule follow-up appointment.

## 2019-10-21 ENCOUNTER — Other Ambulatory Visit (INDEPENDENT_AMBULATORY_CARE_PROVIDER_SITE_OTHER): Payer: Self-pay | Admitting: "Endocrinology

## 2019-10-21 ENCOUNTER — Encounter (INDEPENDENT_AMBULATORY_CARE_PROVIDER_SITE_OTHER): Payer: Self-pay

## 2019-10-21 NOTE — Telephone Encounter (Signed)
No additional refills after this unless appointment is made to be seen.

## 2019-11-07 ENCOUNTER — Encounter (HOSPITAL_COMMUNITY): Payer: Self-pay | Admitting: Emergency Medicine

## 2019-11-07 ENCOUNTER — Observation Stay (HOSPITAL_COMMUNITY)
Admission: EM | Admit: 2019-11-07 | Discharge: 2019-11-07 | Discharge status: Against Medical Advice | Payer: Medicaid Other | Attending: Internal Medicine | Admitting: Internal Medicine

## 2019-11-07 ENCOUNTER — Other Ambulatory Visit: Payer: Self-pay

## 2019-11-07 DIAGNOSIS — Z794 Long term (current) use of insulin: Secondary | ICD-10-CM | POA: Insufficient documentation

## 2019-11-07 DIAGNOSIS — F319 Bipolar disorder, unspecified: Secondary | ICD-10-CM | POA: Diagnosis present

## 2019-11-07 DIAGNOSIS — K219 Gastro-esophageal reflux disease without esophagitis: Secondary | ICD-10-CM | POA: Diagnosis not present

## 2019-11-07 DIAGNOSIS — E1010 Type 1 diabetes mellitus with ketoacidosis without coma: Secondary | ICD-10-CM | POA: Diagnosis not present

## 2019-11-07 DIAGNOSIS — E1065 Type 1 diabetes mellitus with hyperglycemia: Secondary | ICD-10-CM | POA: Insufficient documentation

## 2019-11-07 DIAGNOSIS — E1042 Type 1 diabetes mellitus with diabetic polyneuropathy: Secondary | ICD-10-CM | POA: Insufficient documentation

## 2019-11-07 DIAGNOSIS — Z20822 Contact with and (suspected) exposure to covid-19: Secondary | ICD-10-CM | POA: Diagnosis not present

## 2019-11-07 DIAGNOSIS — E1110 Type 2 diabetes mellitus with ketoacidosis without coma: Secondary | ICD-10-CM | POA: Diagnosis present

## 2019-11-07 DIAGNOSIS — Z79899 Other long term (current) drug therapy: Secondary | ICD-10-CM | POA: Diagnosis not present

## 2019-11-07 DIAGNOSIS — Z9114 Patient's other noncompliance with medication regimen: Secondary | ICD-10-CM | POA: Diagnosis not present

## 2019-11-07 DIAGNOSIS — F17210 Nicotine dependence, cigarettes, uncomplicated: Secondary | ICD-10-CM | POA: Diagnosis not present

## 2019-11-07 LAB — HEMOGLOBIN A1C
Hgb A1c MFr Bld: 11.5 % — ABNORMAL HIGH (ref 4.8–5.6)
Mean Plasma Glucose: 283.35 mg/dL

## 2019-11-07 LAB — BASIC METABOLIC PANEL
Anion gap: 18 — ABNORMAL HIGH (ref 5–15)
Anion gap: 22 — ABNORMAL HIGH (ref 5–15)
BUN: 19 mg/dL (ref 6–20)
BUN: 22 mg/dL — ABNORMAL HIGH (ref 6–20)
CO2: 19 mmol/L — ABNORMAL LOW (ref 22–32)
CO2: 14 mmol/L — ABNORMAL LOW (ref 22–32)
Calcium: 9.5 mg/dL (ref 8.9–10.3)
Calcium: 9.9 mg/dL (ref 8.9–10.3)
Chloride: 96 mmol/L — ABNORMAL LOW (ref 98–111)
Chloride: 97 mmol/L — ABNORMAL LOW (ref 98–111)
Creatinine, Ser: 1.15 mg/dL (ref 0.61–1.24)
Creatinine, Ser: 1.15 mg/dL (ref 0.61–1.24)
GFR calc Af Amer: >60 mL/min (ref >60)
GFR calc Af Amer: >60 mL/min (ref >60)
GFR calc non Af Amer: >60 mL/min (ref >60)
GFR calc non Af Amer: >60 mL/min (ref >60)
Glucose, Bld: 500 mg/dL — ABNORMAL HIGH (ref 70–99)
Glucose, Bld: 452 mg/dL — ABNORMAL HIGH (ref 70–99)
Potassium: 4.4 mmol/L (ref 3.5–5.1)
Potassium: 4.7 mmol/L (ref 3.5–5.1)
Sodium: 133 mmol/L — ABNORMAL LOW (ref 135–145)
Sodium: 133 mmol/L — ABNORMAL LOW (ref 135–145)

## 2019-11-07 LAB — CBC
HCT: 45.4 % (ref 39.0–52.0)
Hemoglobin: 15.7 g/dL (ref 13.0–17.0)
MCH: 29.1 pg (ref 26.0–34.0)
MCHC: 34.6 g/dL (ref 30.0–36.0)
MCV: 84.1 fL (ref 80.0–100.0)
Platelets: 378 10*3/uL (ref 150–400)
RBC: 5.4 MIL/uL (ref 4.22–5.81)
RDW: 12.5 % (ref 11.5–15.5)
WBC: 8.5 10*3/uL (ref 4.0–10.5)
nRBC: 0 % (ref 0.0–0.2)

## 2019-11-07 LAB — SARS CORONAVIRUS 2 BY RT PCR (HOSPITAL ORDER, PERFORMED IN ~~LOC~~ HOSPITAL LAB): SARS Coronavirus 2: NEGATIVE

## 2019-11-07 LAB — CBG MONITORING, ED
Glucose-Capillary: 488 mg/dL — ABNORMAL HIGH (ref 70–99)
Glucose-Capillary: 464 mg/dL — ABNORMAL HIGH (ref 70–99)
Glucose-Capillary: 467 mg/dL — ABNORMAL HIGH (ref 70–99)
Glucose-Capillary: 331 mg/dL — ABNORMAL HIGH (ref 70–99)
Glucose-Capillary: 245 mg/dL — ABNORMAL HIGH (ref 70–99)

## 2019-11-07 LAB — BETA-HYDROXYBUTYRIC ACID: Beta-Hydroxybutyric Acid: 5.57 mmol/L — ABNORMAL HIGH (ref 0.05–0.27)

## 2019-11-07 MED ORDER — INSULIN REGULAR(HUMAN) IN NACL 100-0.9 UT/100ML-% IV SOLN
INTRAVENOUS | Status: DC
  Administered 2019-11-07: 8.5 [IU]/h via INTRAVENOUS
  Filled 2019-11-07: qty 100

## 2019-11-07 MED ORDER — DIVALPROEX SODIUM 250 MG PO DR TAB
500.0000 mg | DELAYED_RELEASE_TABLET | Freq: Every evening | ORAL | Status: DC
  Administered 2019-11-07: 500 mg via ORAL
  Filled 2019-11-07: qty 2

## 2019-11-07 MED ORDER — ONDANSETRON HCL 4 MG/2ML IJ SOLN: 4.0000 mg | Freq: Four times a day (QID) | INTRAMUSCULAR | Status: DC | PRN

## 2019-11-07 MED ORDER — INSULIN REGULAR(HUMAN) IN NACL 100-0.9 UT/100ML-% IV SOLN
INTRAVENOUS | Status: DC
  Administered 2019-11-07: 4 [IU]/h via INTRAVENOUS

## 2019-11-07 MED ORDER — DEXTROSE-NACL 5-0.45 % IV SOLN: INTRAVENOUS | Status: DC

## 2019-11-07 MED ORDER — ONDANSETRON HCL 4 MG/2ML IJ SOLN
4.0000 mg | Freq: Once | INTRAMUSCULAR | Status: AC
  Administered 2019-11-07: 4 mg via INTRAVENOUS
  Filled 2019-11-07: qty 2

## 2019-11-07 MED ORDER — SODIUM CHLORIDE 0.9 % IV SOLN: INTRAVENOUS | Status: DC

## 2019-11-07 MED ORDER — DEXTROSE 50 % IV SOLN: 0.0000 mL | INTRAVENOUS | Status: DC | PRN

## 2019-11-07 MED ORDER — SODIUM CHLORIDE 0.9 % IV BOLUS
1000.0000 mL | INTRAVENOUS | Status: AC
  Administered 2019-11-07 (×2): 1000 mL via INTRAVENOUS

## 2019-11-07 MED ORDER — ENOXAPARIN SODIUM 40 MG/0.4ML ~~LOC~~ SOLN: 40.0000 mg | SUBCUTANEOUS | Status: DC

## 2019-11-07 MED ORDER — POTASSIUM CHLORIDE 10 MEQ/100ML IV SOLN
10.0000 meq | INTRAVENOUS | Status: AC
  Administered 2019-11-07 (×2): 10 meq via INTRAVENOUS
  Filled 2019-11-07 (×2): qty 100

## 2019-11-07 MED ORDER — LACTATED RINGERS IV BOLUS
1000.0000 mL | INTRAVENOUS | Status: DC
  Administered 2019-11-07: 1000 mL via INTRAVENOUS

## 2019-11-07 MED ORDER — ACETAMINOPHEN 325 MG PO TABS: 650.0000 mg | ORAL_TABLET | Freq: Four times a day (QID) | ORAL | Status: DC | PRN

## 2019-11-07 NOTE — H&P (Signed)
History and Physical    Rodney [name] [id number] DOB: Nov 09, 2000 DOA: 11/07/2019  PCP: Patient, No Pcp Per Consultants:  Tobe Sos - endocrinology Patient coming from:  Home - lives with father, twin brother, girlfriend, and child; NOK: [place], [phone]  Chief Complaint: hyperglycemia  HPI: Rodney [name] is a 19 y.o. male with medical history significant of type 1 DM, no longer on insulin pump; hypoglycemic seizures; and bipolar d/o presenting with hyperglycemia.  Diagnosed with T1DM at age 73.  No longer on an insulin pump.  His father knows he forgot the Lantus last night, maybe also the night before.  No recent illness.  History provided by the father, as the patient was too somnolent to answer questions.    ED Course:  Mild DKA from non-compliance.  N/v since last night, glucose 500s.  Does not appear to take meds regularly.  Review of Systems: As per HPI; otherwise review of systems reviewed and negative.   Ambulatory Status:  Ambulates without assistance  COVID Vaccine Status:  None  Past Medical History:  Diagnosis Date  . Abrasion of arm, right 08/22/2011  . Bipolar 1 disorder ([place])   . Depression   . Diabetes mellitus    Type I - insulin pump  . Nasal fracture 08/13/2011   was hit in nose while playing basketball  . Seizures ([place])    x 3 - due to low blood sugar - last time 5-6 yrs. ago    Past Surgical History:  Procedure Laterality Date  . APPENDECTOMY    . CLOSED REDUCTION NASAL FRACTURE  08/24/2011   Procedure: CLOSED REDUCTION NASAL FRACTURE;  Surgeon: [name] Belfast, MD;  Location: [facility];  Service: ENT;  Laterality: N/A;    Social History   Socioeconomic History  . Marital status: Single    Spouse name: Not on file  . Number of children: Not on file  . Years of education: Not on file  . Highest education level: Not on file  Occupational History  . Occupation: window Proofreader  Tobacco Use  . Smoking  status: Current Every Day Smoker    Packs/day: 1.00    Years: 6.00    Pack years: 6.00    Types: Cigarettes  . Smokeless tobacco: Never Used  Vaping Use  . Vaping Use: Never used  Substance and Sexual Activity  . Alcohol use: Yes    Comment: social  . Drug use: Yes    Types: Marijuana  . Sexual activity: Not on file  Other Topics Concern  . Not on file  Social History Narrative   Lives with dad and twin brother. Mom deceased 07/26/2005 from [place]. 8th grade at [name]. Parmelee football. Pt has not smoked cigarettes or marijuana in the last 9 months.    Social Determinants of Health   Financial Resource Strain:   . Difficulty of Paying Living Expenses:   Food Insecurity:   . Worried About Charity fundraiser in the Last Year:   . Arboriculturist in the Last Year:   Transportation Needs:   . Film/video editor (Medical):   Marland Kitchen Lack of Transportation (Non-Medical):   Physical Activity:   . Days of Exercise per Week:   . Minutes of Exercise per Session:   Stress:   . Feeling of Stress :   Social Connections:   . Frequency of Communication with Friends and Family:   . Frequency of Social Gatherings with Friends and Family:   .  Attends Religious Services:   . Active Member of Clubs or Organizations:   . Attends Archivist Meetings:   Marland Kitchen Marital Status:   Intimate Partner Violence:   . Fear of Current or Ex-Partner:   . Emotionally Abused:   Marland Kitchen Physically Abused:   . Sexually Abused:     No Known Allergies  Family History  Problem Relation Age of Onset  . Heart disease Mother        MI at age 28  . Diabetes Paternal Grandfather        type 2  . Hypertension Paternal Grandfather   . Heart disease Paternal Grandfather   . Hepatitis C Brother        older brother (incarcerated)  . Hypertension Father     Prior to Admission medications   Medication Sig Start Date End Date Taking? Authorizing Provider  glucagon (GLUCAGON EMERGENCY) 1 MG injection Inject 1 mg into  the vein once as needed (for hypoglycemia). 02/20/18  Yes Sherrlyn Hock, MD  insulin glargine (LANTUS SOLOSTAR) 100 UNIT/ML Solostar Pen INJECT UP TO 50 UNITS SUBCUTANEOUSLY DAILY Patient taking differently: Inject 32 Units into the skin at bedtime.  09/09/19  Yes Sherrlyn Hock, MD  NOVOLOG FLEXPEN 100 UNIT/ML FlexPen USE UP TO 50 UNITS DAILY. Patient taking differently: Inject 1-16 Units into the skin 3 (three) times daily as needed for high blood sugar.  01/29/19  Yes Sherrlyn Hock, MD  ACCU-CHEK GUIDE test strip USE 6X DAILY TO CHECK GLUCOSE 10/21/19   Sherrlyn Hock, MD  DEPAKOTE 250 MG DR tablet Take two tablets each evening. Patient taking differently: Take 500 mg by mouth every evening.  09/10/15 09/09/16  Sherrlyn Hock, MD  doxycycline (VIBRAMYCIN) 100 MG capsule Take 1 capsule (100 mg total) by mouth 2 (two) times daily. Patient not taking: Reported on 11/07/2019 06/05/19   Lucrezia Starch, MD  Insulin Pen Needle (BD PEN NEEDLE NANO U/F) 32G X 4 MM MISC USE TO INJECT INSULIN VIA INSULIN PEN 7 TIME A DAY Patient taking differently: 1 each by Other route See admin instructions. USE TO INJECT INSULIN VIA INSULIN PEN 7 TIME A DAY 05/28/18   Sherrlyn Hock, MD  omeprazole (PRILOSEC) 40 MG capsule TAKE 1 CAPSULE BY MOUTH EVERY DAY Patient not taking: Reported on 11/07/2019 12/17/18   Sherrlyn Hock, MD  ondansetron (ZOFRAN ODT) 4 MG disintegrating tablet Take 1 tablet (4 mg total) by mouth every 4 (four) hours as needed for nausea or vomiting. Patient not taking: Reported on 01/16/2019 07/09/18   Charlesetta Shanks, MD  RABEprazole (ACIPHEX) 20 MG tablet TAKE 1 TABLET BY MOUTH TWICE A DAY Patient not taking: Reported on 11/07/2019 08/20/19   Sherrlyn Hock, MD  traMADol (ULTRAM) 50 MG tablet Take 1 tablet (50 mg total) by mouth every 6 (six) hours as needed. Patient not taking: Reported on 01/16/2019 09/26/18   Carmin Muskrat, MD    Physical Exam: Vitals:   11/07/19 1427  11/07/19 1434 11/07/19 1527 11/07/19 1528  BP:      Pulse: 75 76 68 65  Resp: 13 11 12 18   Temp:      TempSrc:      SpO2: 97% 98% 97% 100%  Weight:      Height:         . General:  Appears calm and comfortable and is NAD, quite somnolent but awakens to voice/touch . Eyes:  PERRL, EOMI, normal lids, iris . ENT:  grossly normal hearing, lips & tongue, mmm; suboptimal dentition . Neck:  no LAD, masses or thyromegaly . Cardiovascular:  RRR, no m/r/g. No LE edema.  Marland Kitchen Respiratory:   CTA bilaterally with no wheezes/rales/rhonchi.  Normal respiratory effort. . Abdomen:  soft, NT, ND, NABS . Skin:  no rash or induration seen on limited exam; diffuse tattoos . Musculoskeletal:  grossly normal tone BUE/BLE, good ROM, no bony abnormality . Psychiatric:  somnolent mood and affect, speech fluent and appropriate, AOx3 . Neurologic:  CN 2-12 grossly intact, moves all extremities in coordinated fashion    Radiological Exams on Admission: No results found.  EKG:  Not done   Labs on Admission: I have personally reviewed the available labs and imaging studies at the time of the admission.  Pertinent labs:   Na++ 133 CO2 19 Glucose 500 Anion gap 18 Normal CBC UA: >500 glucose    Assessment/Plan Principal Problem:   Diabetic ketoacidosis without coma associated with type 1 diabetes mellitus (HCC) Active Problems:   Bipolar 1 disorder ([place])   DKA -Patient with poor baseline control (A1c 11.5) -No longer on an insulin pump because he developed DKA on it when it didn't give him insulin -Instead, periodic noncompliance with Lantus and/or SSI -No indication of illness as source -Moderate DKA on admission based on HCO3 19,, anion gap 18, patient drowsy -Will observe in SDU with DKA protocol -Would recommend continuing insulin drip at least until morning regardless of rapidity of closure of gap and normalization of labs -K+ normal at time of presentation and so potassium supplementation  added -IVF at 150 cc/hr, NS until glucose <250 and then decrease rate to 125 and change to D51/2NS -Diabetes coordinator consultation requested  Bipolar -Continue Depakote   Note: This patient has been tested and is pending for the novel coronavirus COVID-19.   DVT prophylaxis:  Lovenox Code Status:  Full - confirmed with patient/family Family Communication: Father was present throughout evaluation. Disposition Plan:  The patient is from: home  Anticipated d/c is to: home without [facility] services  Anticipated d/c date will depend on clinical response to treatment, but possibly as early as tomorrow if he has excellent response to treatment  Patient is currently: acutely ill Consults called: Diabetes coordinator Admission status:  It is my clinical opinion that referral for OBSERVATION is reasonable and necessary in this patient based on the above information provided. The aforementioned taken together are felt to place the patient at high risk for further clinical deterioration. However it is anticipated that the patient may be medically stable for discharge from the hospital within 24 to 48 hours.    Karmen Bongo MD [facility]   How to contact the [facility] Attending or Consulting provider [place] or covering provider during after hours [place], for this patient?  1. Check the care team in [facility] and look for a) attending/consulting TR[facility] provider listed and b) the [facility] team listed 2. Log into [email or web address] and use [facility]'s universal password to access. If you do not have the password, please contact the hospital operator. 3. Locate the [facility] provider you are looking for under [facility] and page to a number that you can be directly reached. 4. If you still have difficulty reaching the provider, please page the [facility] (Director on Call) for the Hospitalists listed on amion for assistance.   11/07/2019, 4:00 PM

## 2019-11-07 NOTE — ED Notes (Signed)
Pt refusing to be seen. Pt got upset when staff asked what his blood sugars have been.

## 2019-11-07 NOTE — ED Provider Notes (Signed)
[facility] EMERGENCY DEPARTMENT Provider Note   CSN: [id number] Arrival date & time: 11/07/19  1056     History Chief Complaint  Patient presents with  . Hyperglycemia    Rodney [name] is a 19 y.o. male.  HPI   19yM presenting because he thinks he is in DKA. Began feeling poorly last night. Worsening through out the day today. Nausea and vomiting. Feels achy all over. Very thirsty. Tired. "I think I may have skipped a dose of my lantus recently." Evasive when asked about specifics of insulin usage and glucose monitoring. Endocrinologist is Dr Tobe Sos. He estimates it's been >6 months since he was last seen.   Past Medical History:  Diagnosis Date  . Abrasion of arm, right 08/22/2011  . Bipolar 1 disorder ([place])   . Depression   . Diabetes mellitus    Type I - insulin pump  . Diabetes mellitus type I ([place])   . Nasal fracture 08/13/2011   was hit in nose while playing basketball  . Seizures ([place])    x 3 - due to low blood sugar - last time 5-6 yrs. ago    Patient Active Problem List   Diagnosis Date Noted  . 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 GERD (gastroesophageal reflux disease) 09/15/2015  . Unintentional weight loss 09/15/2015  . Injury of right hand 07/28/2015  . Diabetic ketoacidosis without coma associated with type 1 diabetes mellitus ([place])   . Ketonuria 04/17/2014  . Poorly controlled type 1 diabetes mellitus ([place]) 04/17/2014  . Hyperglycemia due to type 1 diabetes mellitus ([place]) 03/18/2014  . Diabetic ketoacidosis ([place]) 05/20/2013  . Dehydration 05/06/2013  . Non compliance with medical treatment 05/06/2013  . DKA (diabetic ketoacidoses) ([place]) 05/05/2013  . Diabetes ([place]) 03/25/2013  . Mild Diabetic ketoacidosis 03/25/2013  . Nausea with vomiting 03/25/2013  . Adjustment reaction 12/05/2012  . DKA (diabetic ketoacidosis) ([place]) 12/04/2012  . DKA, type 1 ([place]) 12/04/2012  . Non  compliance w medication regimen 11/21/2012  . 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[place]) 08/02/2012  . 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 Fracture, nasal 08/24/2011    Class: Acute  . Goiter   . Hypoglycemia associated with diabetes ([place]) 05/23/2011  . 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 Diabetic autonomic neuropathy ([place]) 05/23/2011  . Tachycardia 05/23/2011  . Type I (juvenile type) diabetes mellitus without mention of complication, uncontrolled 08/30/2010  . 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 APPENDECTOMY    . CLOSED REDUCTION NASAL FRACTURE  08/24/2011   Procedure: CLOSED REDUCTION NASAL FRACTURE;  Surgeon: [name] Belfast, MD;  Location: [facility];  Service: ENT;  Laterality: N/A;      Family History  Problem Relation Age of Onset  . Heart disease Mother        MI at age 91  . Diabetes Paternal Grandfather        type 2  . Hypertension Paternal Grandfather   . Heart disease Paternal Grandfather   . Hepatitis C Brother        older brother (incarcerated)  . Hypertension Father     Social History   Tobacco Use  . Smoking status: Current Every Day Smoker    Packs/day: 1.00    Types: Cigarettes  . Smokeless tobacco: Never Used  Vaping Use  . Vaping Use: Never used  Substance Use  Topics  . Alcohol use: Yes    Comment: social  . Drug use: Yes    Types: Marijuana    Home Medications Prior to Admission medications   Medication Sig Start Date End Date Taking? Authorizing Provider  ACCU-CHEK GUIDE test strip USE 6X DAILY TO CHECK GLUCOSE 10/21/19   Sherrlyn Hock, MD  DEPAKOTE 250 MG DR tablet Take two tablets each evening. Patient taking differently: Take 500 mg by mouth every evening.  09/10/15 09/09/16  Sherrlyn Hock, MD  doxycycline (VIBRAMYCIN) 100 MG capsule Take 1 capsule (100 mg total) by mouth 2 (two)  times daily. 06/05/19   Lucrezia Starch, MD  glucagon (GLUCAGON EMERGENCY) 1 MG injection Inject 1 mg into the vein once as needed (for hypoglycemia). 02/20/18   Sherrlyn Hock, MD  insulin glargine (LANTUS SOLOSTAR) 100 UNIT/ML Solostar Pen INJECT UP TO 50 UNITS SUBCUTANEOUSLY DAILY 09/09/19   Sherrlyn Hock, MD  Insulin Pen Needle (BD PEN NEEDLE NANO U/F) 32G X 4 MM MISC USE TO INJECT INSULIN VIA INSULIN PEN 7 TIME A DAY 05/28/18   Sherrlyn Hock, MD  NOVOLOG FLEXPEN 100 UNIT/ML FlexPen USE UP TO 50 UNITS DAILY. 01/29/19   Sherrlyn Hock, MD  omeprazole (PRILOSEC) 40 MG capsule TAKE 1 CAPSULE BY MOUTH EVERY DAY 12/17/18   Sherrlyn Hock, MD  ondansetron (ZOFRAN ODT) 4 MG disintegrating tablet Take 1 tablet (4 mg total) by mouth every 4 (four) hours as needed for nausea or vomiting. Patient not taking: Reported on 01/16/2019 07/09/18   Charlesetta Shanks, MD  RABEprazole (ACIPHEX) 20 MG tablet TAKE 1 TABLET BY MOUTH TWICE A DAY 08/20/19   Sherrlyn Hock, MD  traMADol (ULTRAM) 50 MG tablet Take 1 tablet (50 mg total) by mouth every 6 (six) hours as needed. Patient not taking: Reported on 01/16/2019 09/26/18   Carmin Muskrat, MD    Allergies    Patient has no known allergies.  Review of Systems   Review of Systems All systems reviewed and negative, other than as noted in HPI.  Physical Exam Updated Vital Signs BP 109/68 (BP Location: Right Arm)   Pulse 74   Temp 98.4 F (36.9 C) (Oral)   Resp 18   Ht 5\' 6"  (1.676 m)   Wt 59 kg   SpO2 100%   BMI 20.99 kg/m   Physical Exam Vitals and nursing note reviewed.  Constitutional:      Appearance: He is well-developed.     Comments: Appears somewhat ill, but not toxic.  He is not encephalopathic.  HENT:     Head: Normocephalic and atraumatic.  Eyes:     General:        Right eye: No discharge.        Left eye: No discharge.     Conjunctiva/sclera: Conjunctivae normal.  Cardiovascular:     Rate and Rhythm: Normal rate  and regular rhythm.     Heart sounds: Normal heart sounds. No murmur heard.  No friction rub. No gallop.   Pulmonary:     Effort: Pulmonary effort is normal. No respiratory distress.     Breath sounds: Normal breath sounds.  Abdominal:     General: There is no distension.     Palpations: Abdomen is soft.     Tenderness: There is no abdominal tenderness.  Musculoskeletal:        General: No tenderness.     Cervical back: Neck supple.  Skin:    General: Skin  is warm and dry.  Neurological:     Mental Status: He is alert.  Psychiatric:        Behavior: Behavior normal.        Thought Content: Thought content normal.     ED Results / Procedures / Treatments   Labs (all labs ordered are listed, but only abnormal results are displayed) Labs Reviewed  BASIC METABOLIC PANEL - Abnormal; Notable for the following components:      Result Value   Sodium 133 (*)    Chloride 96 (*)    CO2 19 (*)    Glucose, Bld 500 (*)    Anion gap 18 (*)    All other components within normal limits  BETA-HYDROXYBUTYRIC ACID - Abnormal; Notable for the following components:   Beta-Hydroxybutyric Acid 5.57 (*)    All other components within normal limits  HEMOGLOBIN A1C - Abnormal; Notable for the following components:   Hgb A1c MFr Bld 11.5 (*)    All other components within normal limits  BASIC METABOLIC PANEL - Abnormal; Notable for the following components:   Sodium 133 (*)    Chloride 97 (*)    CO2 14 (*)    Glucose, Bld 452 (*)    BUN 22 (*)    Anion gap 22 (*)    All other components within normal limits  CBG MONITORING, ED - Abnormal; Notable for the following components:   Glucose-Capillary 488 (*)    All other components within normal limits  CBG MONITORING, ED - Abnormal; Notable for the following components:   Glucose-Capillary 464 (*)    All other components within normal limits  CBG MONITORING, ED - Abnormal; Notable for the following components:   Glucose-Capillary 467 (*)     All other components within normal limits  CBG MONITORING, ED - Abnormal; Notable for the following components:   Glucose-Capillary 331 (*)    All other components within normal limits  CBG MONITORING, ED - Abnormal; Notable for the following components:   Glucose-Capillary 245 (*)    All other components within normal limits  SARS CORONAVIRUS 2 BY RT PCR (HOSPITAL ORDER, [facility])  CBC    EKG None  Radiology No results found.  Procedures Procedures (including critical care time)  CRITICAL CARE Performed by: Virgel Manifold Total critical care time: 35 minutes Critical care time was exclusive of separately billable procedures and treating other patients. Critical care was necessary to treat or prevent imminent or life-threatening deterioration. Critical care was time spent personally by me on the following activities: development of treatment plan with patient and/or surrogate as well as nursing, discussions with consultants, evaluation of patient's response to treatment, examination of patient, obtaining history from patient or surrogate, ordering and performing treatments and interventions, ordering and review of laboratory studies, ordering and review of radiographic studies, pulse oximetry and re-evaluation of patient's condition.   Medications Ordered in ED Medications - No data to display  ED Course  I have reviewed the triage vital signs and the nursing notes.  Pertinent labs & imaging results that were available during my care of the patient were reviewed by me and considered in my medical decision making (see chart for details).    MDM Rules/Calculators/A&P                           19yM with mild DKA. Suspect from poor/intermittent compliance with his medications and glucose monitoring. He  is not encephalopathic. Plan IVF. Insulin gtt. Electrolyte management. Admit.   Final Clinical Impression(s) / ED Diagnoses Final diagnoses:  Type 1  diabetes mellitus with ketoacidosis without coma [facility])    Rx / DC Orders ED Discharge Orders    None       Virgel Manifold, MD 11/08/19 0730

## 2019-11-07 NOTE — ED Triage Notes (Signed)
Pt brought to Ed by [facility] from home for high Blood sugar with nausea. CBG 450, 800 mL NS given, 4 mg Zofran IV given. BP 148/84, HR 96, R18 SPO2 100% RA.

## 2019-11-07 NOTE — Progress Notes (Signed)
Patient states that he wants to leave. Staff informed patient of DKA and the dangers. Patient states he does not care and wants to leave. Patient signed AMA father is aware and stated " He's a grown man I can make him stay if he doesn't want to."

## 2019-11-12 ENCOUNTER — Other Ambulatory Visit: Payer: Self-pay | Admitting: "Endocrinology

## 2019-11-12 DIAGNOSIS — E1065 Type 1 diabetes mellitus with hyperglycemia: Secondary | ICD-10-CM

## 2019-11-21 ENCOUNTER — Telehealth (INDEPENDENT_AMBULATORY_CARE_PROVIDER_SITE_OTHER): Payer: Self-pay | Admitting: "Endocrinology

## 2019-11-21 NOTE — Telephone Encounter (Signed)
Who's calling (name and relationship to patient) : Mack Guise self  Best contact number: [phone]  Provider they see: Dr. Tobe Sos  Reason for call: Requesting refill for lantus   Call ID:      [place]  Name of prescription: lantus  Pharmacy: [facility] [address]

## 2019-11-22 ENCOUNTER — Other Ambulatory Visit (INDEPENDENT_AMBULATORY_CARE_PROVIDER_SITE_OTHER): Payer: Self-pay

## 2019-11-22 ENCOUNTER — Other Ambulatory Visit: Payer: Self-pay

## 2019-11-22 ENCOUNTER — Ambulatory Visit (INDEPENDENT_AMBULATORY_CARE_PROVIDER_SITE_OTHER): Payer: Medicaid Other | Admitting: "Endocrinology

## 2019-11-22 ENCOUNTER — Encounter (INDEPENDENT_AMBULATORY_CARE_PROVIDER_SITE_OTHER): Payer: Self-pay | Admitting: "Endocrinology

## 2019-11-22 VITALS — BP 124/78 | HR 80 | Wt 132.8 lb

## 2019-11-22 DIAGNOSIS — E049 Nontoxic goiter, unspecified: Secondary | ICD-10-CM | POA: Diagnosis not present

## 2019-11-22 DIAGNOSIS — F319 Bipolar disorder, unspecified: Secondary | ICD-10-CM

## 2019-11-22 DIAGNOSIS — E1043 Type 1 diabetes mellitus with diabetic autonomic (poly)neuropathy: Secondary | ICD-10-CM

## 2019-11-22 DIAGNOSIS — I10 Essential (primary) hypertension: Secondary | ICD-10-CM

## 2019-11-22 DIAGNOSIS — E1065 Type 1 diabetes mellitus with hyperglycemia: Secondary | ICD-10-CM

## 2019-11-22 DIAGNOSIS — Z9119 Patient's noncompliance with other medical treatment and regimen: Secondary | ICD-10-CM | POA: Diagnosis not present

## 2019-11-22 DIAGNOSIS — E11649 Type 2 diabetes mellitus with hypoglycemia without coma: Secondary | ICD-10-CM | POA: Diagnosis not present

## 2019-11-22 DIAGNOSIS — E1010 Type 1 diabetes mellitus with ketoacidosis without coma: Secondary | ICD-10-CM | POA: Diagnosis not present

## 2019-11-22 DIAGNOSIS — S161XXA Strain of muscle, fascia and tendon at neck level, initial encounter: Secondary | ICD-10-CM

## 2019-11-22 DIAGNOSIS — I4711 Inappropriate sinus tachycardia, so stated: Secondary | ICD-10-CM

## 2019-11-22 DIAGNOSIS — Z91199 Patient's noncompliance with other medical treatment and regimen due to unspecified reason: Secondary | ICD-10-CM

## 2019-11-22 DIAGNOSIS — E063 Autoimmune thyroiditis: Secondary | ICD-10-CM

## 2019-11-22 DIAGNOSIS — R000 Tachycardia, unspecified: Secondary | ICD-10-CM

## 2019-11-22 LAB — POCT GLUCOSE (DEVICE FOR HOME USE): POC Glucose: 201 mg/dl — AB (ref 70–99)

## 2019-11-22 MED ORDER — ACCU-CHEK GUIDE VI STRP: ORAL_STRIP | 5 refills | Status: DC

## 2019-11-22 MED ORDER — LANTUS SOLOSTAR 100 UNIT/ML ~~LOC~~ SOPN: PEN_INJECTOR | SUBCUTANEOUS | 5 refills | Status: DC

## 2019-11-22 NOTE — Patient Instructions (Signed)
Follow up visit in 3 months Please call Dr. Tobe Sos on Wednesday, 7/28, between 8:00-9:30 PM.

## 2019-11-22 NOTE — Telephone Encounter (Signed)
Seen in clinic today by Dr. Tobe Sos, script sent during visit.
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
## 2019-11-24 ENCOUNTER — Other Ambulatory Visit: Payer: Self-pay | Admitting: "Endocrinology

## 2019-11-24 DIAGNOSIS — E1065 Type 1 diabetes mellitus with hyperglycemia: Secondary | ICD-10-CM

## 2019-11-25 ENCOUNTER — Telehealth (INDEPENDENT_AMBULATORY_CARE_PROVIDER_SITE_OTHER): Payer: Self-pay | Admitting: "Endocrinology

## 2019-11-25 NOTE — Telephone Encounter (Signed)
  Who's calling (name and relationship to patient) : Mahamadou (self)  Best contact number: [phone]  Provider they see: Dr. Tobe Sos  Reason for call: Needs refill sent to pharmacy    PRESCRIPTION REFILL ONLY  Name of prescription: insulin glargine (LANTUS SOLOSTAR) 100 UNIT/ML Solostar Pen  Pharmacy: [id number] - [place], [place]

## 2019-11-25 NOTE — Telephone Encounter (Signed)
°  Who's calling (name and relationship to patient) :Self / Rodney [name]   Best contact [phone]  Provider they see:Dr. Tobe Sos   Reason for call:Medication Refill      PRESCRIPTION REFILL ONLY  Name of prescription: Lantus   Pharmacy:[facility] [place]

## 2019-11-25 NOTE — Telephone Encounter (Signed)
Spoke with patient and let him know a prescription was sent in his morning.   Patient was reminded that Dr. Tobe Sos wanted him to call in Wednesday.   Patient states understanding and ended the call.

## 2019-12-16 ENCOUNTER — Encounter: Payer: Self-pay | Admitting: Physical Therapy

## 2019-12-16 ENCOUNTER — Other Ambulatory Visit: Payer: Self-pay

## 2019-12-16 ENCOUNTER — Ambulatory Visit: Payer: Medicaid Other | Attending: "Endocrinology | Admitting: Physical Therapy

## 2019-12-16 DIAGNOSIS — R293 Abnormal posture: Secondary | ICD-10-CM | POA: Diagnosis present

## 2019-12-16 DIAGNOSIS — M542 Cervicalgia: Secondary | ICD-10-CM | POA: Insufficient documentation

## 2019-12-16 DIAGNOSIS — M25662 Stiffness of left knee, not elsewhere classified: Secondary | ICD-10-CM | POA: Insufficient documentation

## 2019-12-16 DIAGNOSIS — M6281 Muscle weakness (generalized): Secondary | ICD-10-CM | POA: Diagnosis present

## 2019-12-16 DIAGNOSIS — M25562 Pain in left knee: Secondary | ICD-10-CM | POA: Insufficient documentation
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
## 2020-01-01 ENCOUNTER — Other Ambulatory Visit: Payer: Self-pay

## 2020-01-01 ENCOUNTER — Encounter: Payer: Self-pay | Admitting: Physical Therapy

## 2020-01-01 ENCOUNTER — Ambulatory Visit: Payer: Medicaid Other | Attending: "Endocrinology | Admitting: Physical Therapy

## 2020-01-01 DIAGNOSIS — R293 Abnormal posture: Secondary | ICD-10-CM | POA: Diagnosis present

## 2020-01-01 DIAGNOSIS — M25562 Pain in left knee: Secondary | ICD-10-CM

## 2020-01-01 DIAGNOSIS — M542 Cervicalgia: Secondary | ICD-10-CM

## 2020-01-01 DIAGNOSIS — M6281 Muscle weakness (generalized): Secondary | ICD-10-CM

## 2020-01-01 DIAGNOSIS — M25662 Stiffness of left knee, not elsewhere classified: Secondary | ICD-10-CM

## 2020-01-01 NOTE — Therapy (Signed)
[place], [place], [address] Phone: [phone]   Fax:  [phone]  Physical Therapy Treatment  Patient Details  Name: Rodney [name] MRN: [id number] Date of Birth: 2000/09/28 Referring Provider (PT): Tillman Sers, MD   Encounter Date: 01/01/2020   PT End of Session - 01/01/20 1542    Visit Number 2    Number of Visits 12    Date for PT Re-Evaluation 02/03/20    Authorization Type Medicaid-UHC managed    PT Start Time 1504    PT Stop Time 1547    PT Time Calculation (min) 43 min    Activity Tolerance Patient tolerated treatment well    Behavior During Therapy [facility] for tasks assessed/performed           Past Medical History:  Diagnosis Date  . Abrasion of arm, right 08/22/2011  . Bipolar 1 disorder ([place])   . Depression   . Diabetes mellitus    Type I - insulin pump  . Nasal fracture 08/13/2011   was hit in nose while playing basketball  . Seizures ([place])    x 3 - due to low blood sugar - last time 5-6 yrs. ago    Past Surgical History:  Procedure Laterality Date  . APPENDECTOMY    . CLOSED REDUCTION NASAL FRACTURE  08/24/2011   Procedure: CLOSED REDUCTION NASAL FRACTURE;  Surgeon: [name] Belfast, MD;  Location: [place];  Service: ENT;  Laterality: N/A;    There were no vitals filed for this visit.   Subjective Assessment - 01/01/20 1507    Subjective Pt. reports still having some right-sided neck pain with turning head but neck is improving. He continues with left lateral knee pain today 3/10.    Pertinent History type 1 diabetic, history seizures associated with hypoglycemia, depression/bipolar    Pain Score 3    Pain Location Knee    Pain Orientation Left;Lateral    Pain Descriptors / Indicators Sharp;Burning    Pain Type Acute pain    Pain Onset More than a month ago    Pain Frequency Intermittent    Aggravating Factors  bending knee    Pain Relieving Factors  rest, medication                             [facility] Adult PT Treatment/Exercise - 01/01/20 0001      Exercises   Exercises Neck;Knee/Hip      Neck Exercises: Theraband   Shoulder Extension 20 reps;Green    Rows 20 reps;Blue      Knee/Hip Exercises: Stretches   ITB Stretch Left;3 reps;20 seconds    ITB Stretch Limitations supine manual stretch      Knee/Hip Exercises: Aerobic   Nustep L3 x 5 min UE/LE      Knee/Hip Exercises: Standing   Terminal Knee Extension AROM;Strengthening;Left;15 reps    Theraband Level (Terminal Knee Extension) Level 3 (Green)    Hip Abduction AROM;Stengthening;Left;2 sets;10 reps    Abduction Limitations red Theraband proximal to knees      Knee/Hip Exercises: Supine   Quad Sets AROM;Strengthening;Left;15 reps    Quad Sets Limitations towel roll under left knee     Bridges AROM;Strengthening;15 reps    Bridges Limitations legs over reversed incline wedge    Straight Leg Raises AROM;Strengthening;Left;2 sets;10 reps      Neck Exercises: Stretches   Upper Trapezius Stretch Right;3 reps;20 seconds  Other Neck Stretches right supine manual SCM stretch 3x20 sec            Trigger Point Dry Needling - 01/01/20 0001    Consent Given? Yes    Education Handout Provided Yes    Muscles Treated Head and Neck Sternocleidomastoid;Upper trapezius;Cervical multifidi   right side   Muscles Treated Lower Quadrant Vastus lateralis   left side distal vastus lateralis   Dry Needling Comments needling for left vastus lateralis and right SCM in supine without estim then needling of right cervical multifidi (C3 region) and upper trapezius in prone, 32 gauge 30 mm needles used except 32 gauge 50 mm needle for cervical multifidi    Electrical Stimulation Performed with Dry Needling Yes    E-stim with Dry Needling Details TENS right upper trapezius 20 pps x 10 minutes                PT Education - 01/01/20 1541    Education Details  exercises, dry needling    Person(s) Educated Patient    Methods Explanation;Verbal cues;Handout    Comprehension Verbalized understanding;Returned demonstration            PT Short Term Goals - 12/16/19 1725      PT SHORT TERM GOAL #1   Title Independent with basic/initial HEP    Baseline instructed at eval today    Time 3    Period Weeks    Status New      PT SHORT TERM GOAL #2   Title Turn head for driving with neck pain decreased 50% or greater from current status    Baseline painful for rotation    Time 3    Period Weeks    Status New             PT Long Term Goals - 12/16/19 1727      PT LONG TERM GOAL #1   Title Independent with advanced HEP for continued progression after d/c from formal therapy    Baseline will instruct as appropriate    Time 6    Period Weeks    Status New    Target Date 01/27/20      PT LONG TERM GOAL #2   Title Increase left knee strength to 5/5 to improve ability for stair navigation, 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    Baseline 4/5    Time 6    Period Weeks    Status New    Target Date 01/27/20      PT LONG TERM GOAL #3   Title Increase left knee flexion AROM 10 deg or greater to improve ability to bend knee for activities such as donning shoes, kneeling positions, transfers from low seats and stair navigation    Baseline 109 deg    Time 6    Period Weeks    Status New    Target Date 01/27/20      PT LONG TERM GOAL #4   Title Increase left cervical rotation AROM at least 10 deg to improve ability to turn head while driving    Baseline 68 deg    Time 6    Period Weeks    Status New    Target Date 01/27/20      PT LONG TERM GOAL #5   Title Perform work duties and IADLs, community ambulation as needed with left knee pain 3/10 or less    Baseline pain up to 8/10 at worst    Time 6  Period Weeks    Status New    Target Date 01/27/20                 Plan - 01/01/20 1542    Clinical  Impression Statement Pt. returns for first follow up tx. session-making progress so far with HEP for neck, fair stauts for knee with continued lateral knee pain. Tx. focus exercises as noted per flowsheet and also performed trial dry needling to help address myofascial symptoms with good tolerance. Will await further tx. response by next session and progress as tolerated.    Personal Factors and Comorbidities Comorbidity 3+;Other    Comorbidities diabetes, tobacco use, history depression/bipolar    Examination-Activity Limitations Squat;Lift;Locomotion Level;Carry    Examination-Participation Restrictions Driving;Community Activity;Cleaning;Occupation    Stability/Clinical Decision Making Evolving/Moderate complexity    Clinical Decision Making Moderate    Rehab Potential Good    PT Frequency 2x / week    PT Duration 6 weeks    PT Treatment/Interventions ADLs/Self Care Home Management;Cryotherapy;Traction;Ultrasound;Electrical Stimulation;Iontophoresis 4mg /ml Dexamethasone;Moist Heat;Therapeutic activities;Functional mobility training;Therapeutic exercise;Neuromuscular re-education;Patient/family education;Manual techniques;Dry needling;Taping    PT Next Visit Plan check response dry needling, for knee progress strengthening as tolerated initial open chain focus but add/progress CKC activities as tolerated, stretch IT band, for neck continue stretches, postural correction and strengthening as tolerated, further dry needling as found beneficial    PT Home Exercise Plan Access code: [email or web address]    Consulted and Agree with Plan of Care Patient           Patient will benefit from skilled therapeutic intervention in order to improve the following deficits and impairments:  Pain, Postural dysfunction, Decreased strength, Impaired flexibility, Decreased activity tolerance, Decreased range of motion, Difficulty walking, Impaired sensation  Visit Diagnosis: Cervicalgia  Abnormal posture  Acute pain of  left knee  Stiffness of left knee, not elsewhere classified  Muscle weakness (generalized)     Problem List Patient Active Problem List   Diagnosis Date Noted  . Bipolar 1 disorder ([place]) 11/07/2019  . 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 GERD (gastroesophageal reflux disease) 09/15/2015  . Unintentional weight loss 09/15/2015  . Injury of right hand 07/28/2015  . Diabetic ketoacidosis without coma associated with type 1 diabetes mellitus ([place])   . Ketonuria 04/17/2014  . Poorly controlled type 1 diabetes mellitus ([place]) 04/17/2014  . Hyperglycemia due to type 1 diabetes mellitus ([place]) 03/18/2014  . Diabetic ketoacidosis ([place]) 05/20/2013  . Dehydration 05/06/2013  . Non compliance with medical treatment 05/06/2013  . DKA (diabetic ketoacidoses) ([place]) 05/05/2013  . Diabetes ([place]) 03/25/2013  . Mild Diabetic ketoacidosis 03/25/2013  . Nausea with vomiting 03/25/2013  . Adjustment reaction 12/05/2012  . DKA, type 1 ([place]) 12/04/2012  . Non compliance w medication regimen 11/21/2012  . 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[place]) 08/02/2012  . 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 Fracture, nasal 08/24/2011    Class: Acute  . Goiter   . Hypoglycemia associated with diabetes ([place]) 05/23/2011  . 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 Diabetic autonomic neuropathy ([place]) 05/23/2011  . Tachycardia 05/23/2011  . Type I (juvenile type) diabetes mellitus without mention of complication, uncontrolled 08/30/2010  . 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

## 2020-01-01 NOTE — Patient Instructions (Signed)

## 2020-01-03 ENCOUNTER — Ambulatory Visit: Payer: Medicaid Other | Admitting: Physical Therapy

## 2020-01-03 ENCOUNTER — Telehealth: Payer: Self-pay | Admitting: Physical Therapy

## 2020-01-03 NOTE — Telephone Encounter (Signed)
Attempted to call pt regarding missed appointment today. There was no answer and the voicemail box had not been set up.  Gorman Safi PT, DPT, LAT, ATC  01/03/20  2:18 PM

## 2020-01-08 ENCOUNTER — Ambulatory Visit: Payer: Medicaid Other | Admitting: Physical Therapy

## 2020-01-10 ENCOUNTER — Telehealth: Payer: Self-pay | Admitting: Physical Therapy

## 2020-01-10 NOTE — Telephone Encounter (Signed)
Attempted to call regarding no show for 01/08/20 therapy appointment. No further visits currently scheduled. No answer and unable to leave voicemail which was not set up per automated message.

## 2020-02-21 NOTE — Therapy (Signed)
[place], [place], [address] Phone: [phone]   Fax:  [phone]  Physical Therapy Treatment.Discharge  Patient Details  Name: Rodney [name] MRN: [id number] Date of Birth: [birth year] Referring Provider (PT): Tillman Sers, MD   Encounter Date: 01/01/2020    Past Medical History:  Diagnosis Date  . Abrasion of arm, right 08/22/2011  . Bipolar 1 disorder ([place])   . Depression   . Diabetes mellitus    Type I - insulin pump  . Nasal fracture 08/13/2011   was hit in nose while playing basketball  . Seizures ([place])    x 3 - due to low blood sugar - last time 5-6 yrs. ago    Past Surgical History:  Procedure Laterality Date  . APPENDECTOMY    . CLOSED REDUCTION NASAL FRACTURE  08/24/2011   Procedure: CLOSED REDUCTION NASAL FRACTURE;  Surgeon: [name] Belfast, MD;  Location: [facility];  Service: ENT;  Laterality: N/A;    There were no vitals filed for this visit.                                PT Short Term Goals - 12/16/19 1725      PT SHORT TERM GOAL #1   Title Independent with basic/initial HEP    Baseline instructed at eval today    Time 3    Period Weeks    Status New      PT SHORT TERM GOAL #2   Title Turn head for driving with neck pain decreased 50% or greater from current status    Baseline painful for rotation    Time 3    Period Weeks    Status New             PT Long Term Goals - 12/16/19 1727      PT LONG TERM GOAL #1   Title Independent with advanced HEP for continued progression after d/c from formal therapy    Baseline will instruct as appropriate    Time 6    Period Weeks    Status New    [place] Date 01/27/20      PT LONG TERM GOAL #2   Title Increase left knee strength to 5/5 to improve ability for stair navigation, 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    Baseline 4/5    Time 6    Period Weeks     Status New    Target Date 01/27/20      PT LONG TERM GOAL #3   Title Increase left knee flexion AROM 10 deg or greater to improve ability to bend knee for activities such as donning shoes, kneeling positions, transfers from low seats and stair navigation    Baseline 109 deg    Time 6    Period Weeks    Status New    Target Date 01/27/20      PT LONG TERM GOAL #4   Title Increase left cervical rotation AROM at least 10 deg to improve ability to turn head while driving    Baseline 68 deg    Time 6    Period Weeks    Status New    Target Date 01/27/20      PT LONG TERM GOAL #5   Title Perform work duties and IADLs, community ambulation as needed with left knee pain 3/10 or less    Baseline  pain up to 8/10 at worst    Time 6    Period Weeks    Status New    Target Date 01/27/20                  Patient will benefit from skilled therapeutic intervention in order to improve the following deficits and impairments:  Pain, Postural dysfunction, Decreased strength, Impaired flexibility, Decreased activity tolerance, Decreased range of motion, Difficulty walking, Impaired sensation  Visit Diagnosis: Cervicalgia  Abnormal posture  Acute pain of left knee  Stiffness of left knee, not elsewhere classified  Muscle weakness (generalized)     Problem List Patient Active Problem List   Diagnosis Date Noted  . Bipolar 1 disorder ([place]) 11/07/2019  . 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 GERD (gastroesophageal reflux disease) 09/15/2015  . Unintentional weight loss 09/15/2015  . Injury of right hand 07/28/2015  . Diabetic ketoacidosis without coma associated with type 1 diabetes mellitus ([place])   . Ketonuria 04/17/2014  . Poorly controlled type 1 diabetes mellitus ([place]) 04/17/2014  . Hyperglycemia due to type 1 diabetes mellitus ([place]) 03/18/2014  . Diabetic ketoacidosis ([place]) 05/20/2013  . Dehydration 05/06/2013  .  Non compliance with medical treatment 05/06/2013  . DKA (diabetic ketoacidoses) ([place]) 05/05/2013  . Diabetes ([place]) 03/25/2013  . Mild Diabetic ketoacidosis 03/25/2013  . Nausea with vomiting 03/25/2013  . Adjustment reaction 12/05/2012  . DKA, type 1 ([place]) 12/04/2012  . Non compliance w medication regimen 11/21/2012  . 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[place]) 08/02/2012  . 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 Fracture, nasal 08/24/2011    Class: Acute  . Goiter   . Hypoglycemia associated with diabetes ([place]) 05/23/2011  . 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 Diabetic autonomic neuropathy ([place]) 05/23/2011  . Tachycardia 05/23/2011  . Type I (juvenile type) diabetes mellitus without mention of complication, uncontrolled 08/30/2010  . 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       PHYSICAL THERAPY DISCHARGE SUMMARY  Visits from Start of Care: 2  Current functional level related to goals / functional outcomes: Patient did not return for further therapy after last session 01/01/20 with no show for last 2 scheduled therapy visits.   Remaining deficits: Current status unknown   Education / Equipment: HEP Plan: Patient agrees to discharge.  Patient goals were not met. Patient is being discharged due to not returning since the last visit.  ?????           Beaulah Dinning, PT, DPT 02/21/20 11:42 AM      [facility] [address] [place], [place], [address] Phone: [phone]   Fax:  [phone]  Name: PORTER MOES MRN: [id number] Date of Birth: 30-Nov-2000

## 2020-05-24 IMAGING — CR LEFT HAND - COMPLETE 3+ VIEW
3 series · 3 of 3 positions shown · non-contrast
Comparison: Hand radiograph 09/26/2018

CLINICAL DATA: Patient with injury to the hand. Fifth metacarpal
pain

EXAM:
LEFT HAND - COMPLETE 3+ VIEW

[x hand pa left]
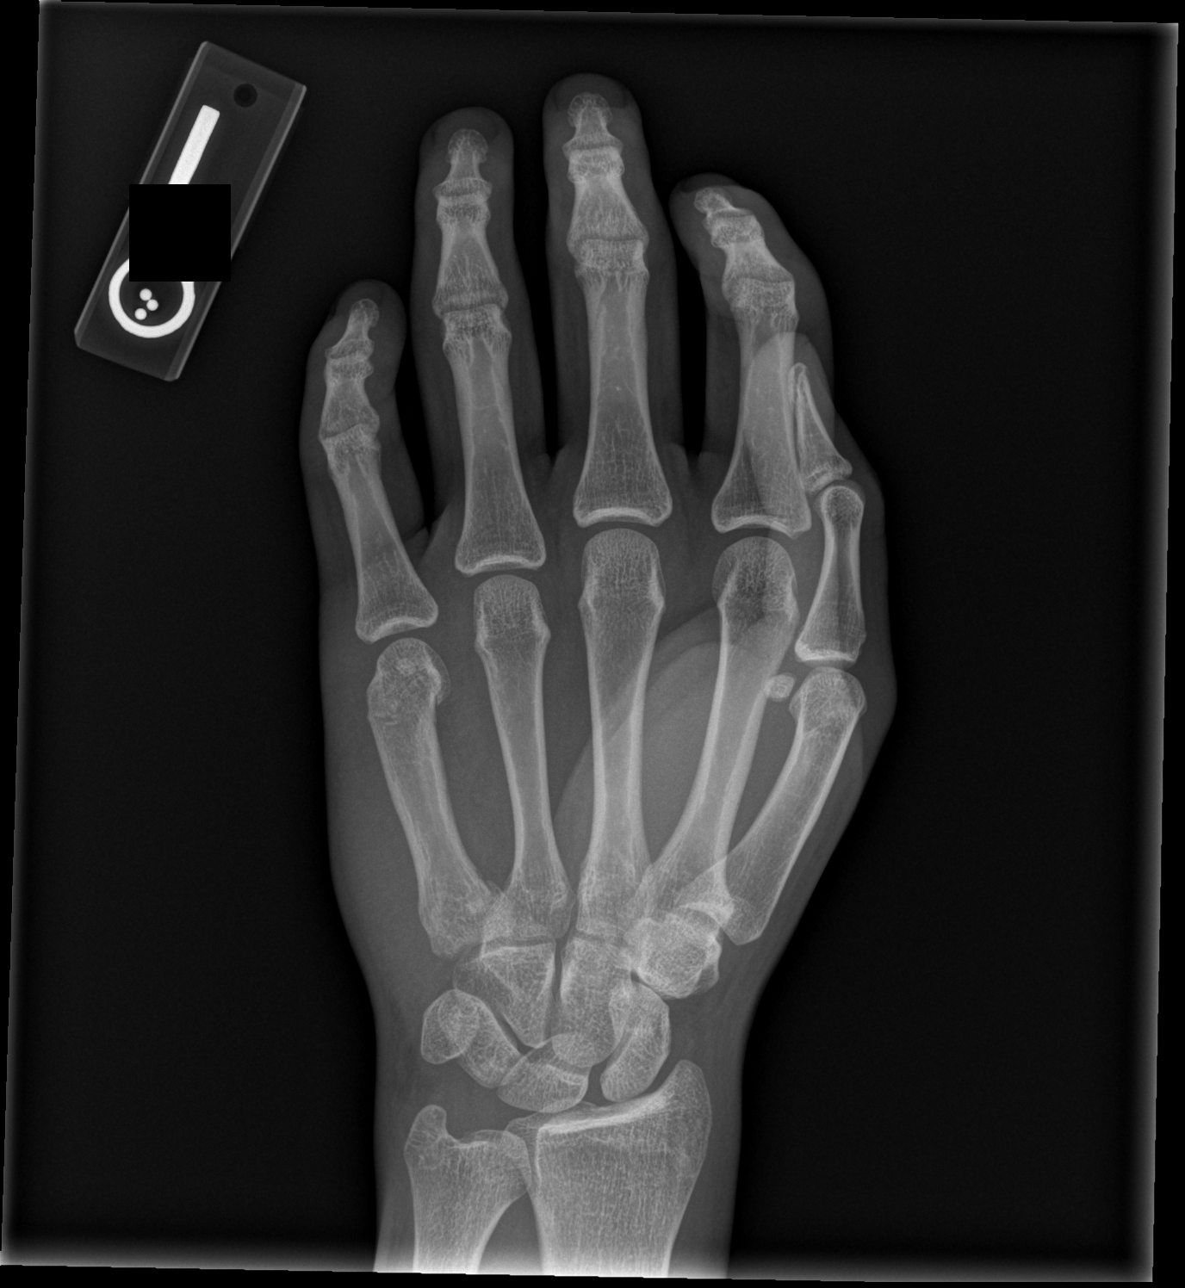

[x hand obl left]
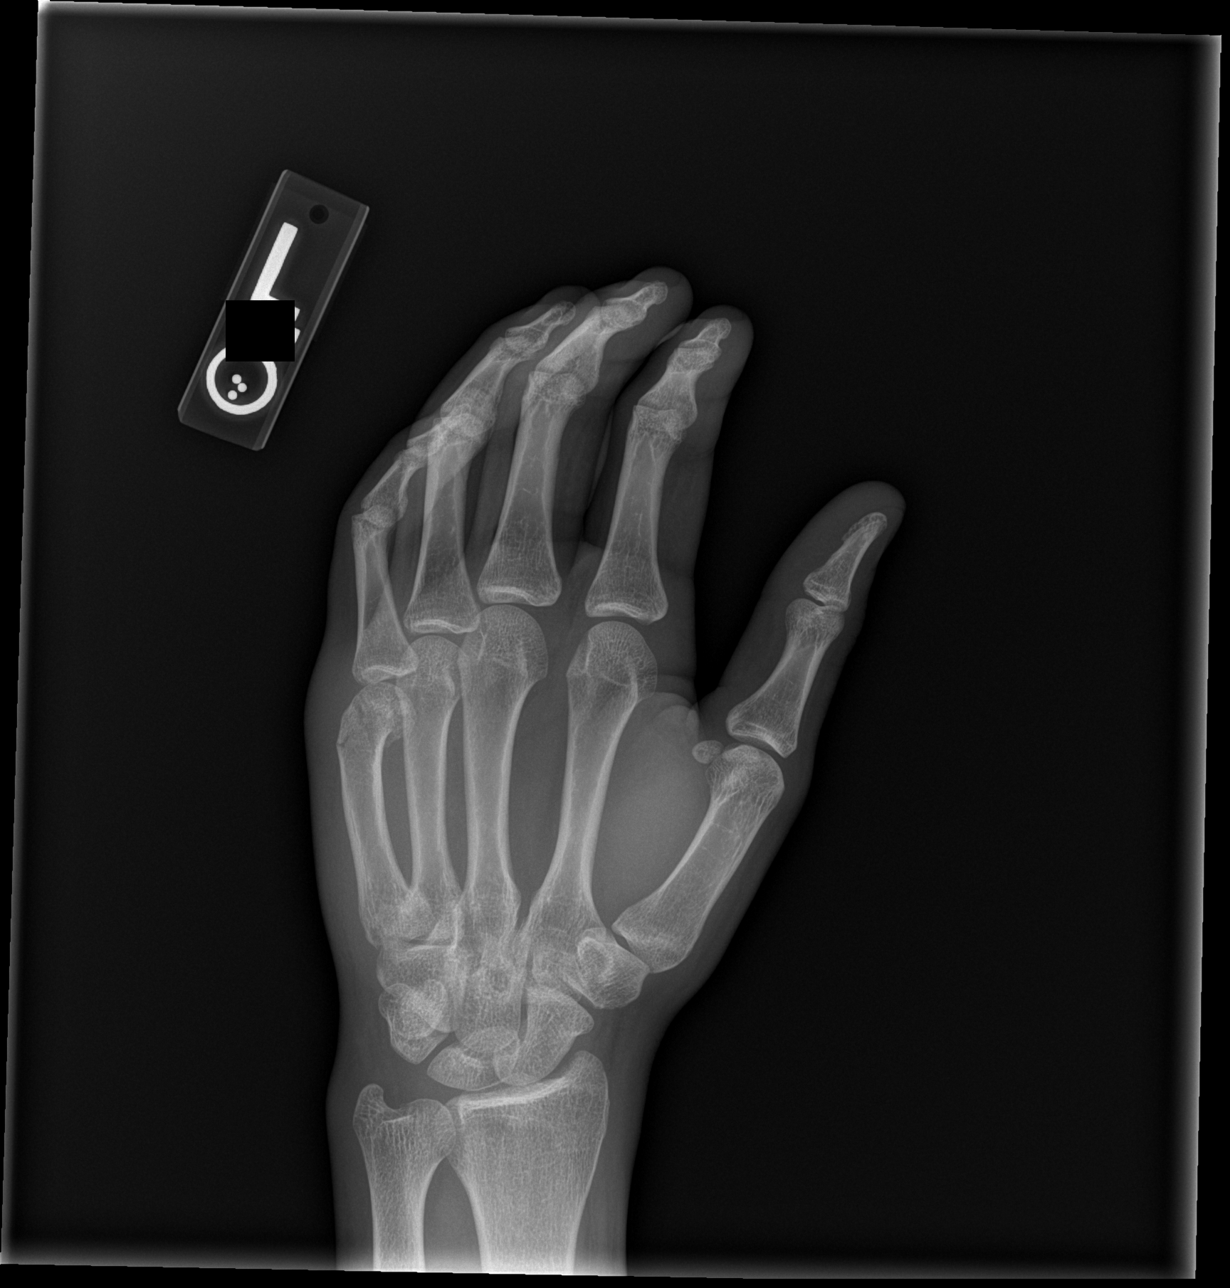

[x hand lat left]
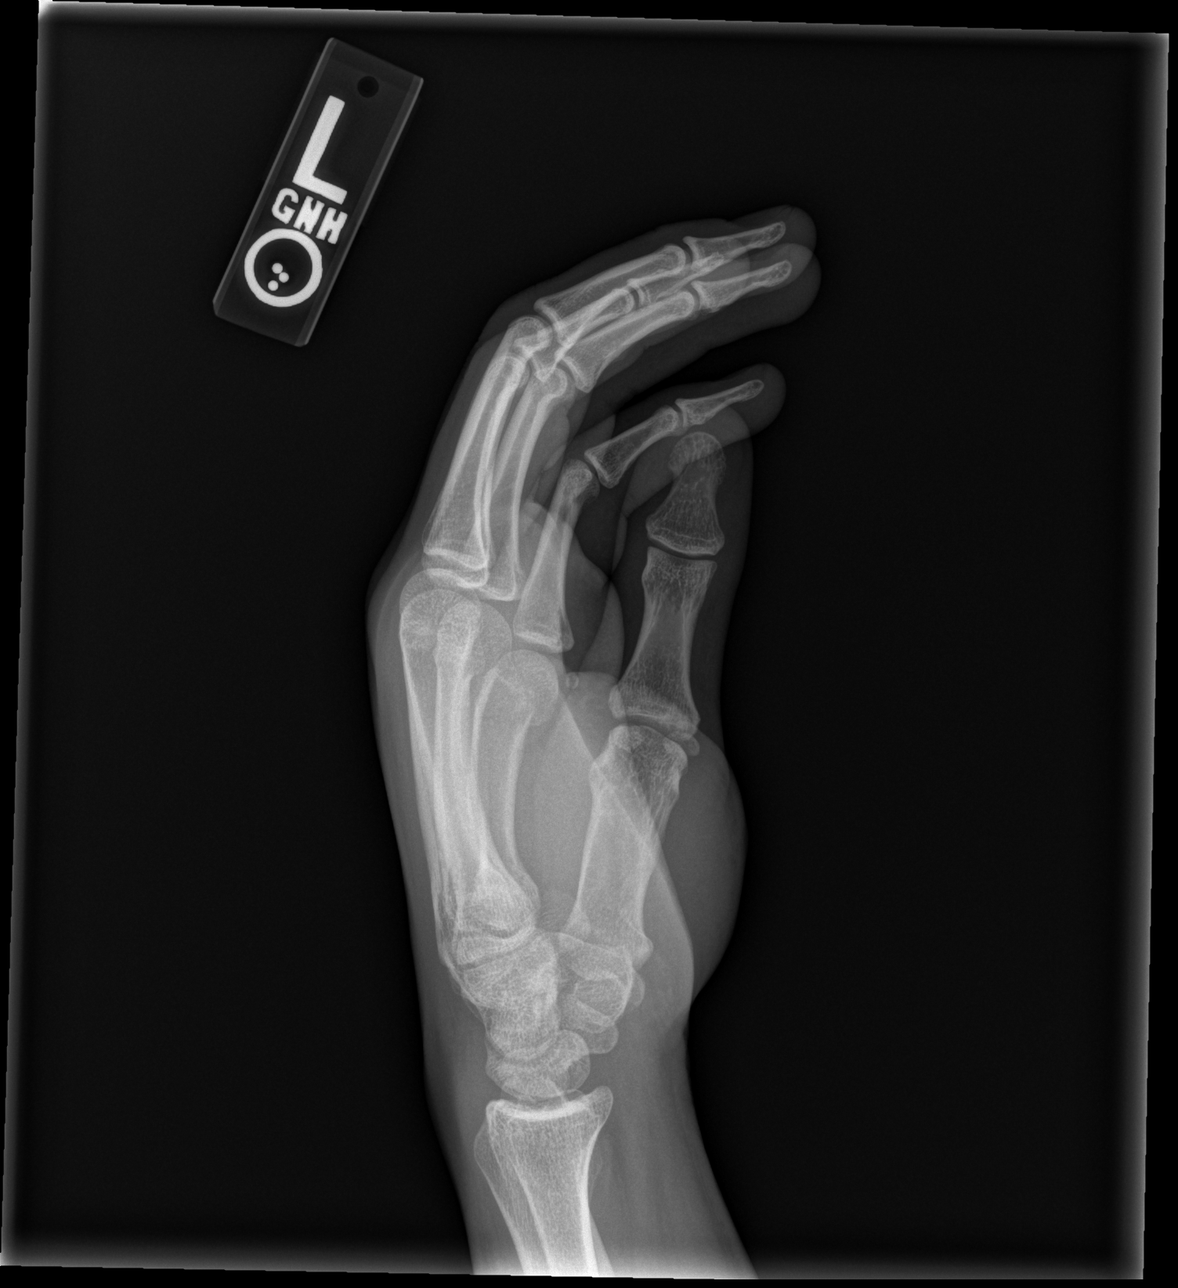

[3 of 3 positions shown; findings below may reference images not displayed]

FINDINGS: There is a comminuted fracture through the distal aspect of the
fifth metacarpal with mild volar displacement of the metacarpal
head. Overlying soft tissue swelling. No evidence for acute
fractures or dislocation. Healed fracture through the base of the
fifth metacarpal from prior fracture.
IMPRESSION: Mildly displaced comminuted fracture of the distal fifth metacarpal.
Overlying soft tissue swelling.

## 2020-06-08 ENCOUNTER — Other Ambulatory Visit: Payer: Self-pay | Admitting: "Endocrinology

## 2020-06-08 ENCOUNTER — Encounter (INDEPENDENT_AMBULATORY_CARE_PROVIDER_SITE_OTHER): Payer: Self-pay

## 2020-06-08 DIAGNOSIS — E1065 Type 1 diabetes mellitus with hyperglycemia: Secondary | ICD-10-CM

## 2020-07-24 ENCOUNTER — Telehealth (INDEPENDENT_AMBULATORY_CARE_PROVIDER_SITE_OTHER): Payer: Self-pay | Admitting: "Endocrinology

## 2020-07-24 DIAGNOSIS — E1065 Type 1 diabetes mellitus with hyperglycemia: Secondary | ICD-10-CM

## 2020-07-24 MED ORDER — ACCU-CHEK GUIDE VI STRP: ORAL_STRIP | 5 refills | Status: DC

## 2020-07-24 MED ORDER — NOVOLOG FLEXPEN 100 UNIT/ML ~~LOC~~ SOPN: PEN_INJECTOR | SUBCUTANEOUS | 5 refills | Status: DC

## 2020-07-24 MED ORDER — LANTUS SOLOSTAR 100 UNIT/ML ~~LOC~~ SOPN: PEN_INJECTOR | SUBCUTANEOUS | 2 refills | Status: DC

## 2020-07-24 NOTE — Telephone Encounter (Signed)
  Who's calling (name and relationship to patient) :Self / [name]  Best contact [phone]  Provider they see:Dr. Tobe Sos   Reason for call:Needs refill for insulin and Strips      PRESCRIPTION REFILL ONLY  Name of prescription:Insulin and strips   Pharmacy:[facility] on [address], [place]
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
## 2020-07-27 ENCOUNTER — Ambulatory Visit (INDEPENDENT_AMBULATORY_CARE_PROVIDER_SITE_OTHER): Payer: Medicaid Other | Admitting: "Endocrinology

## 2020-08-07 ENCOUNTER — Telehealth (INDEPENDENT_AMBULATORY_CARE_PROVIDER_SITE_OTHER): Payer: Self-pay | Admitting: "Endocrinology

## 2020-08-07 MED ORDER — "INSULIN SYRINGE-NEEDLE U-100 31G X 5/16"" 1 ML MISC": 0 refills | Status: AC

## 2020-08-07 NOTE — Telephone Encounter (Signed)
  Who's calling (name and relationship to patient) : [place] ( self)  Best contact number: [phone]  Provider they see: Dr. Tobe Sos  Reason for call: Patient calling he is completely out of insulin and has not taken his dose for today      [place]  Name of prescription: Novolog insulin  Pharmacy: [facility]

## 2020-08-07 NOTE — Telephone Encounter (Signed)
Spoke with patient. Let him know that we have a vial of Fiasp insulin he can come get to hold him over until he can get his refill from the pharmacy. I have sent in syringes for the vial. Patient said that he will try to find a ride to come get the insulin.
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
## 2020-08-07 NOTE — Telephone Encounter (Signed)
Patient is asking if there is an over the counter medication that will help him and is similar to novolog. Please call to advise what to do. Patient wants to know if there are a couple pens here of novolog he could have

## 2020-08-19 ENCOUNTER — Ambulatory Visit (INDEPENDENT_AMBULATORY_CARE_PROVIDER_SITE_OTHER): Payer: Medicaid Other | Admitting: "Endocrinology
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
## 2020-08-27 ENCOUNTER — Emergency Department (HOSPITAL_BASED_OUTPATIENT_CLINIC_OR_DEPARTMENT_OTHER)
Admission: EM | Admit: 2020-08-27 | Discharge: 2020-08-27 | Disposition: A | Discharge status: Home / Self Care | Payer: Medicaid Other | Attending: Emergency Medicine | Admitting: Emergency Medicine

## 2020-08-27 ENCOUNTER — Encounter (HOSPITAL_BASED_OUTPATIENT_CLINIC_OR_DEPARTMENT_OTHER): Payer: Self-pay

## 2020-08-27 DIAGNOSIS — S6992XA Unspecified injury of left wrist, hand and finger(s), initial encounter: Secondary | ICD-10-CM | POA: Diagnosis present

## 2020-08-27 DIAGNOSIS — W2209XA Striking against other stationary object, initial encounter: Secondary | ICD-10-CM | POA: Diagnosis not present

## 2020-08-27 DIAGNOSIS — Z5321 Procedure and treatment not carried out due to patient leaving prior to being seen by health care provider: Secondary | ICD-10-CM | POA: Insufficient documentation
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
## 2020-09-02 ENCOUNTER — Other Ambulatory Visit (INDEPENDENT_AMBULATORY_CARE_PROVIDER_SITE_OTHER): Payer: Self-pay | Admitting: "Endocrinology

## 2020-09-08 ENCOUNTER — Telehealth (INDEPENDENT_AMBULATORY_CARE_PROVIDER_SITE_OTHER): Payer: Self-pay | Admitting: "Endocrinology

## 2020-09-08 DIAGNOSIS — E1065 Type 1 diabetes mellitus with hyperglycemia: Secondary | ICD-10-CM

## 2020-09-08 MED ORDER — LANTUS SOLOSTAR 100 UNIT/ML ~~LOC~~ SOPN: PEN_INJECTOR | SUBCUTANEOUS | 0 refills | Status: DC

## 2020-09-08 NOTE — Telephone Encounter (Signed)
Who's calling (name and relationship to patient) : Cebastian (self)  Best contact number: [phone]  Provider they see: Dr. Tobe Sos  Reason for call:  Ladell called in requesting a refill on his Lantus. Patient has not been seen since July 2021, writer scheduled appointment for 5/11 at 2:45. Did notify patient that I would send message back regarding Lantus refill.   Call ID:      PRESCRIPTION REFILL ONLY  Name of prescription:  insulin glargine (LANTUS SOLOSTAR) 100 UNIT/ML Solostar Pen [[id number]]    Pharmacy:  [id number] - [place], [address], [place] [address]  Phone:  [phone] Fax:  [phone]  DEA #:  [id number]

## 2020-09-08 NOTE — Telephone Encounter (Signed)
Called patient to see if he has enough Lantus for tonights dose.  He stated he has been out for a few days.  I will send in 1 months refill and reminded him about tomorrow's appointment.

## 2020-09-09 ENCOUNTER — Ambulatory Visit (INDEPENDENT_AMBULATORY_CARE_PROVIDER_SITE_OTHER): Payer: Medicaid Other | Admitting: "Endocrinology
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
## 2020-11-13 ENCOUNTER — Encounter (HOSPITAL_BASED_OUTPATIENT_CLINIC_OR_DEPARTMENT_OTHER): Payer: Self-pay

## 2020-11-13 ENCOUNTER — Other Ambulatory Visit: Payer: Self-pay

## 2020-11-13 ENCOUNTER — Emergency Department (HOSPITAL_BASED_OUTPATIENT_CLINIC_OR_DEPARTMENT_OTHER)
Admission: EM | Admit: 2020-11-13 | Discharge: 2020-11-13 | Disposition: A | Discharge status: Home / Self Care | Payer: Medicaid Other | Source: Home / Self Care

## 2020-11-13 ENCOUNTER — Encounter (HOSPITAL_BASED_OUTPATIENT_CLINIC_OR_DEPARTMENT_OTHER): Payer: Self-pay | Admitting: *Deleted

## 2020-11-13 ENCOUNTER — Emergency Department (HOSPITAL_BASED_OUTPATIENT_CLINIC_OR_DEPARTMENT_OTHER): Payer: Medicaid Other

## 2020-11-13 ENCOUNTER — Emergency Department (HOSPITAL_BASED_OUTPATIENT_CLINIC_OR_DEPARTMENT_OTHER)
Admission: EM | Admit: 2020-11-13 | Discharge: 2020-11-13 | Disposition: A | Discharge status: Home / Self Care | Payer: Medicaid Other | Attending: Emergency Medicine | Admitting: Emergency Medicine

## 2020-11-13 DIAGNOSIS — Z794 Long term (current) use of insulin: Secondary | ICD-10-CM | POA: Diagnosis not present

## 2020-11-13 DIAGNOSIS — E1010 Type 1 diabetes mellitus with ketoacidosis without coma: Secondary | ICD-10-CM | POA: Diagnosis not present

## 2020-11-13 DIAGNOSIS — M79642 Pain in left hand: Secondary | ICD-10-CM | POA: Diagnosis not present

## 2020-11-13 DIAGNOSIS — E1065 Type 1 diabetes mellitus with hyperglycemia: Secondary | ICD-10-CM | POA: Diagnosis not present

## 2020-11-13 DIAGNOSIS — F17210 Nicotine dependence, cigarettes, uncomplicated: Secondary | ICD-10-CM | POA: Diagnosis not present

## 2020-11-13 DIAGNOSIS — E1040 Type 1 diabetes mellitus with diabetic neuropathy, unspecified: Secondary | ICD-10-CM | POA: Insufficient documentation

## 2020-11-13 DIAGNOSIS — Z5321 Procedure and treatment not carried out due to patient leaving prior to being seen by health care provider: Secondary | ICD-10-CM | POA: Insufficient documentation

## 2020-11-13 DIAGNOSIS — R739 Hyperglycemia, unspecified: Secondary | ICD-10-CM | POA: Insufficient documentation

## 2020-11-13 LAB — COMPREHENSIVE METABOLIC PANEL
ALT: 61 U/L — ABNORMAL HIGH (ref 0–44)
AST: 53 U/L — ABNORMAL HIGH (ref 15–41)
Albumin: 3.8 g/dL (ref 3.5–5.0)
Alkaline Phosphatase: 107 U/L (ref 38–126)
Anion gap: 11 (ref 5–15)
BUN: 17 mg/dL (ref 6–20)
CO2: 27 mmol/L (ref 22–32)
Calcium: 9 mg/dL (ref 8.9–10.3)
Chloride: 86 mmol/L — ABNORMAL LOW (ref 98–111)
Creatinine, Ser: 1.16 mg/dL (ref 0.61–1.24)
GFR, Estimated: >60 mL/min (ref >60)
Glucose, Bld: 932 mg/dL (ref 70–99)
Potassium: 5.1 mmol/L (ref 3.5–5.1)
Sodium: 124 mmol/L — ABNORMAL LOW (ref 135–145)
Total Bilirubin: 0.6 mg/dL (ref 0.3–1.2)
Total Protein: 6.9 g/dL (ref 6.5–8.1)

## 2020-11-13 LAB — I-STAT VENOUS BLOOD GAS, ED
Acid-Base Excess: 4 mmol/L — ABNORMAL HIGH (ref 0.0–2.0)
Bicarbonate: 30.6 mmol/L — ABNORMAL HIGH (ref 20.0–28.0)
Calcium, Ion: 1.13 mmol/L — ABNORMAL LOW (ref 1.15–1.40)
HCT: 44 % (ref 39.0–52.0)
Hemoglobin: 15 g/dL (ref 13.0–17.0)
O2 Saturation: 81 %
Potassium: 5.7 mmol/L — ABNORMAL HIGH (ref 3.5–5.1)
Sodium: 125 mmol/L — ABNORMAL LOW (ref 135–145)
TCO2: 32 mmol/L (ref 22–32)
pCO2, Ven: 51.1 mmHg (ref 44.0–60.0)
pH, Ven: 7.385 (ref 7.250–7.430)
pO2, Ven: 47 mmHg — ABNORMAL HIGH (ref 32.0–45.0)

## 2020-11-13 LAB — CBC WITH DIFFERENTIAL/PLATELET
Abs Immature Granulocytes: 0.01 10*3/uL (ref 0.00–0.07)
Basophils Absolute: 0 10*3/uL (ref 0.0–0.1)
Basophils Relative: 1 %
Eosinophils Absolute: 0 10*3/uL (ref 0.0–0.5)
Eosinophils Relative: 1 %
HCT: 44.9 % (ref 39.0–52.0)
Hemoglobin: 15.1 g/dL (ref 13.0–17.0)
Immature Granulocytes: 0 %
Lymphocytes Relative: 24 %
Lymphs Abs: 1.3 10*3/uL (ref 0.7–4.0)
MCH: 29.8 pg (ref 26.0–34.0)
MCHC: 33.6 g/dL (ref 30.0–36.0)
MCV: 88.6 fL (ref 80.0–100.0)
Monocytes Absolute: 0.4 10*3/uL (ref 0.1–1.0)
Monocytes Relative: 8 %
Neutro Abs: 3.6 10*3/uL (ref 1.7–7.7)
Neutrophils Relative %: 66 %
Platelets: 311 10*3/uL (ref 150–400)
RBC: 5.07 MIL/uL (ref 4.22–5.81)
RDW: 12.5 % (ref 11.5–15.5)
WBC: 5.4 10*3/uL (ref 4.0–10.5)
nRBC: 0 % (ref 0.0–0.2)

## 2020-11-13 LAB — LIPASE, BLOOD: Lipase: 24 U/L (ref 11–51)

## 2020-11-13 LAB — URINALYSIS, ROUTINE W REFLEX MICROSCOPIC
Bilirubin Urine: NEGATIVE
Glucose, UA: >=500 mg/dL — AB
Hgb urine dipstick: NEGATIVE
Ketones, ur: NEGATIVE mg/dL
Leukocytes,Ua: NEGATIVE
Nitrite: NEGATIVE
Protein, ur: NEGATIVE mg/dL
Specific Gravity, Urine: <1.005 — ABNORMAL LOW (ref 1.005–1.030)
pH: 6 (ref 5.0–8.0)

## 2020-11-13 LAB — URINALYSIS, MICROSCOPIC (REFLEX)
RBC / HPF: NONE SEEN RBC/hpf (ref 0–5)
Squamous Epithelial / HPF: NONE SEEN (ref 0–5)
WBC, UA: NONE SEEN WBC/hpf (ref 0–5)

## 2020-11-13 LAB — CBG MONITORING, ED
Glucose-Capillary: >600 mg/dL (ref 70–99)
Glucose-Capillary: >600 mg/dL (ref 70–99)

## 2020-11-13 MED ORDER — LACTATED RINGERS IV SOLN: INTRAVENOUS | Status: DC

## 2020-11-13 MED ORDER — LACTATED RINGERS IV BOLUS
1000.0000 mL | Freq: Once | INTRAVENOUS | Status: AC
  Administered 2020-11-13: 1000 mL via INTRAVENOUS

## 2020-11-13 MED ORDER — INSULIN REGULAR(HUMAN) IN NACL 100-0.9 UT/100ML-% IV SOLN: INTRAVENOUS | Status: DC

## 2020-11-13 MED ORDER — LACTATED RINGERS IV BOLUS: 1000.0000 mL | Freq: Once | INTRAVENOUS | Status: DC

## 2020-11-13 MED ORDER — DEXTROSE IN LACTATED RINGERS 5 % IV SOLN: INTRAVENOUS | Status: DC

## 2020-11-13 MED ORDER — DEXTROSE 50 % IV SOLN: 0.0000 mL | INTRAVENOUS | Status: DC | PRN

## 2020-11-13 NOTE — ED Provider Notes (Signed)
[facility] Provider Note   CSN: [id number] Arrival date & time: 11/13/20  1208     History Chief Complaint  Patient presents with   Hyperglycemia    [name] is a 20 y.o. male.  Patient is a 20 year old male with a history of type 1 diabetes on insulin, seizures, bipolar disease, gastroparesis, GERD who is presenting today with complaint of pain in his left hand after he was chopping some wood and now having severe pain in the left hand.  He cannot remember but he was supposed to have surgery on one of his hands due to a recent injury and thinks it may be that 1.  He states his fingers feel numb and tingly since he has been having the pain in the hand.  Secondly he has also noticed for the last 2 to 3 days his blood sugar has been high and he has been having intermittent vomiting.  He did recently change to a different insulin about 2 weeks ago but sugars have been okay until a few days ago.  He has also been drinking a lot of sugary drinks and not sticking with a diabetic diet.  He is having diffuse abdominal cramping but denies any urinary symptoms other than polyuria.  No diarrhea.  No fever, cough, congestion or shortness of breath.  The history is provided by the patient.  Hyperglycemia     Past Medical History:  Diagnosis Date   Abrasion of arm, right 08/22/2011   Bipolar 1 disorder (HCC)    Depression    Diabetes mellitus    Type I - insulin pump   Nasal fracture 08/13/2011   was hit in nose while playing basketball   Seizures ([place])    x 3 - due to low blood sugar - last time 5-6 yrs. 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   GERD (gastroesophageal reflux disease) 09/15/2015   Unintentional weight loss 09/15/2015   Injury of right hand 07/28/2015   Diabetic ketoacidosis without coma  associated with type 1 diabetes mellitus ([place])    Ketonuria 04/17/2014   Poorly controlled type 1 diabetes mellitus ([place]) 04/17/2014   Hyperglycemia due to type 1 diabetes mellitus ([place]) 03/18/2014   Diabetic ketoacidosis ([place]) 05/20/2013   Dehydration 05/06/2013   Non compliance with medical treatment 05/06/2013   DKA (diabetic ketoacidoses) ([place]) 05/05/2013   Diabetes ([place]) 03/25/2013   Mild Diabetic ketoacidosis 03/25/2013   Nausea with vomiting 03/25/2013   Adjustment reaction 12/05/2012   DKA, type 1 ([place]) 12/04/2012   Non compliance w medication regimen 11/21/2012   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[place]) 08/02/2012   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   Fracture, nasal 08/24/2011    Class: Acute   Goiter    Hypoglycemia associated with diabetes ([place]) 05/23/2011   Thyroiditis, 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/23/2011   Diabetic autonomic neuropathy ([place]) 05/23/2011   Tachycardia 05/23/2011   Type I (juvenile type) diabetes mellitus without mention of complication, uncontrolled 08/30/2010   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    Past Surgical History:  Procedure Laterality Date   APPENDECTOMY     CLOSED REDUCTION NASAL FRACTURE  08/24/2011   Procedure: CLOSED REDUCTION NASAL FRACTURE;  Surgeon: [name] Belfast, MD;  Location: [facility];  Service: ENT;  Laterality: N/A;  Family History  Problem Relation Age of Onset   Heart disease Mother        MI at age 84   Diabetes Paternal Grandfather        type 2   Hypertension Paternal Grandfather    Heart disease Paternal Grandfather    Hepatitis C Brother        older brother (incarcerated)   Hypertension Father     Social History   Tobacco Use   Smoking status: Every Day    Packs/day: 1.00    Years: 6.00    Pack years: 6.00    Types: Cigarettes   Smokeless tobacco: Never  Vaping Use   Vaping Use: Never used  Substance Use Topics    Alcohol use: Not Currently   Drug use: Yes    Types: Marijuana    Home Medications Prior to Admission medications   Medication Sig Start Date End Date Taking? Authorizing Provider  Accu-Chek FastClix Lancets MISC CHECK SUGAR 10 X DAILY 06/25/15   [provider]  DEPAKOTE 250 MG DR tablet Take two tablets each evening. Patient taking differently: Take 500 mg by mouth every evening.  09/10/15 09/09/16  Sherrlyn Hock, MD  glucagon (GLUCAGON EMERGENCY) 1 MG injection Inject 1 mg into the vein once as needed (for hypoglycemia). Patient not taking: Reported on 11/22/2019 02/20/18   Sherrlyn Hock, MD  glucose blood (ACCU-CHEK GUIDE) test strip USE 6X DAILY TO CHECK GLUCOSE 07/24/20   Sherrlyn Hock, MD  insulin aspart (NOVOLOG FLEXPEN) 100 UNIT/ML FlexPen USE UP TO 50 UNITS DAILY. 07/24/20   Sherrlyn Hock, MD  insulin glargine (LANTUS SOLOSTAR) 100 UNIT/ML Solostar Pen Use up to 50 units daily 09/08/20   Sherrlyn Hock, MD  Insulin Pen Needle (BD PEN NEEDLE NANO U/F) 32G X 4 MM MISC USE TO INJECT INSULIN VIA INSULIN PEN 7 TIME A DAY Patient taking differently: 1 each by Other route See admin instructions. USE TO INJECT INSULIN VIA INSULIN PEN 7 TIME A DAY 05/28/18   Sherrlyn Hock, MD  Insulin Syringe-Needle U-100 31G X 5/16" 1 ML MISC Use with insulin vial 08/07/20   Sherrlyn Hock, MD    Allergies    Patient has no known allergies.  Review of Systems   Review of Systems  All other systems reviewed and are negative.  Physical Exam Updated Vital Signs BP (!) 129/101 (BP Location: Left Arm)   Pulse 76   Resp 20   Ht 5\' 6"  (1.676 m)   Wt 55.3 kg   SpO2 100%   BMI 19.69 kg/m   Physical Exam Vitals and nursing note reviewed.  Constitutional:      General: He is not in acute distress.    Appearance: He is well-developed.     Comments: Is not smell of ketones.  HENT:     Head: Normocephalic and atraumatic.     Mouth/Throat:     Mouth: Mucous membranes  are dry.  Eyes:     Conjunctiva/sclera: Conjunctivae normal.     Pupils: Pupils are equal, round, and reactive to light.  Cardiovascular:     Rate and Rhythm: Normal rate and regular rhythm.     Pulses: Normal pulses.     Heart sounds: No murmur heard. Pulmonary:     Effort: Pulmonary effort is normal. No respiratory distress.     Breath sounds: Normal breath sounds. No wheezing or rales.  Abdominal:     General: There is no  distension.     Palpations: Abdomen is soft.     Tenderness: There is abdominal tenderness. There is no guarding or rebound.     Comments: Mild diffuse tenderness without rebound or guarding.  Musculoskeletal:        General: Tenderness present. Normal range of motion.       Hands:     Cervical back: Normal range of motion and neck supple.  Skin:    General: Skin is warm and dry.     Findings: No erythema or rash.  Neurological:     Mental Status: He is alert and oriented to person, place, and time. Mental status is at baseline.  Psychiatric:        Mood and Affect: Mood normal.        Behavior: Behavior normal.    ED Results / Procedures / Treatments   Labs (all labs ordered are listed, but only abnormal results are displayed) Labs Reviewed  COMPREHENSIVE METABOLIC PANEL - Abnormal; Notable for the following components:      Result Value   Sodium 124 (*)    Chloride 86 (*)    Glucose, Bld 932 (*)    AST 53 (*)    ALT 61 (*)    All other components within normal limits  CBG MONITORING, ED - Abnormal; Notable for the following components:   Glucose-Capillary >600 (*)    All other components within normal limits  I-STAT VENOUS BLOOD GAS, ED - Abnormal; Notable for the following components:   pO2, Ven 47.0 (*)    Bicarbonate 30.6 (*)    Acid-Base Excess 4.0 (*)    Sodium 125 (*)    Potassium 5.7 (*)    Calcium, Ion 1.13 (*)    All other components within normal limits  RESP PANEL BY RT-PCR (FLU A&B, COVID) ARPGX2  CBC WITH DIFFERENTIAL/PLATELET   LIPASE, BLOOD  URINALYSIS, ROUTINE W REFLEX MICROSCOPIC  CBG MONITORING, ED    EKG None  Radiology DG Hand Complete Left  Result Date: 11/13/2020 CLINICAL DATA:  Hand pain Blunt trauma.  Chopping wood injury EXAM: LEFT HAND - COMPLETE 3+ VIEW COMPARISON:  None. FINDINGS: No evidence of fracture of the carpal or metacarpal bones. Radiocarpal joint is intact. Phalanges are normal. No soft tissue injury. IMPRESSION: No fracture or dislocation. Electronically Signed   By: Suzy Bouchard M.D.   On: 11/13/2020 14:13    Procedures Procedures   Medications Ordered in ED Medications  lactated ringers bolus 1,000 mL (has no administration in time range)    ED Course  I have reviewed the triage vital signs and the nursing notes.  Pertinent labs & imaging results that were available during my care of the patient were reviewed by me and considered in my medical decision making (see chart for details).    MDM Rules/Calculators/A&P                          Patient is a 20 year old male with a history of type 1 diabetes presenting today with concern for DKA with diffuse abdominal pain, vomiting and high blood sugar for the last 2 to 3 days.  Patient is not tachycardic, tachypneic and does not smell strongly of ketones however blood sugar is greater than 600.  Labs are pending and patient given IV fluids.  Secondly patient is complaining of hand pain after chopping wood with a recent injury that he thinks may have required surgery on the left hand.  He is neurovascularly intact but does have some mild subjective tingling in his fingers.  Plain films are pending.  2:47 PM CBC within normal limits, venous blood gas with normal pH and CO2, DM he with pseudohyponatremia with a sodium of 124 and a blood sugar of 932.  Mild elevated LFTs with an AST of 53 and ALT of 61.  Anion gap of 11.  Film of the hand is negative.  Patient has received 1 L of LR.  Went back to speak with the patient and told him  the results.  Discussed that felt it would be best for the patient to be admitted for ongoing fluids and insulin.  Patient at this time refuses any further medical care.  Ask if he would at least stay for a second liter of fluid and some insulin to get his blood sugar in a more manageable range and he reports he is ready to leave now.  Patient understands that symptoms could become worse and he can go into DKA.  His father is also present with him in the room and he repeatedly reports he is ready to leave.  Patient is awake and alert and able to make his own decisions.  Encourage patient to return to the emergency room for worsening symptoms.  However at this time he is choosing to leave.  MDM   Amount and/or Complexity of Data Reviewed Clinical lab tests: ordered and reviewed Tests in the radiology section of CPT: ordered and reviewed Tests in the medicine section of CPT: ordered and reviewed Independent visualization of images, tracings, or specimens: yes  Risk of Complications, Morbidity, and/or Mortality Presenting problems: high Diagnostic procedures: moderate Management options: moderate   CRITICAL CARE Performed by: Rayel Santizo Total critical care time: 30 minutes Critical care time was exclusive of separately billable procedures and treating other patients. Critical care was necessary to treat or prevent imminent or life-threatening deterioration. Critical care was time spent personally by me on the following activities: development of treatment plan with patient and/or surrogate as well as nursing, discussions with consultants, evaluation of patient's response to treatment, examination of patient, obtaining history from patient or surrogate, ordering and performing treatments and interventions, ordering and review of laboratory studies, ordering and review of radiographic studies, pulse oximetry and re-evaluation of patient's condition.   Final Clinical Impression(s) / ED  Diagnoses Final diagnoses:  Hyperglycemia  Left hand pain    Rx / DC Orders ED Discharge Orders     None        Blanchie Dessert, MD 11/13/20 1450

## 2020-11-13 NOTE — ED Triage Notes (Signed)
Returned after leaving AMA earlier today with a CBG of 932

## 2020-11-13 NOTE — Discharge Instructions (Addendum)
Your blood sugar today is 932.  You do not have evidence of DKA at this time.  However with your blood sugar being so elevated you could go into DKA very easily.  You are choosing to leave [facility].  If you start feeling worse please return to the emergency room.  You would benefit from IV fluids and insulin.  The x-ray of your hand today was normal without any signs of broken bones and it is probably bruised from the work you are doing.  You need to call your regular doctor for follow-up as soon as possible.

## 2020-11-13 NOTE — ED Notes (Signed)
Reg clerk states pt went out the door-after triage pt was given paper scrub top due to vomit on shirt-was directed to BR to change and to have a seat in the ED WR

## 2020-11-13 NOTE — ED Triage Notes (Signed)
Pt c/o n/v started last night-feels he is in DKA-noncompliant DM-also c/o pain to left hand from previous injury that he was suppose to have surgery-denies recent injury-actively vomiting-steady gait

## 2020-11-14 ENCOUNTER — Emergency Department (HOSPITAL_BASED_OUTPATIENT_CLINIC_OR_DEPARTMENT_OTHER)
Admission: EM | Admit: 2020-11-14 | Discharge: 2020-11-14 | Discharge status: Against Medical Advice | Payer: Medicaid Other | Attending: Emergency Medicine | Admitting: Emergency Medicine

## 2020-11-14 ENCOUNTER — Other Ambulatory Visit: Payer: Self-pay

## 2020-11-14 ENCOUNTER — Encounter (HOSPITAL_BASED_OUTPATIENT_CLINIC_OR_DEPARTMENT_OTHER): Payer: Self-pay | Admitting: Emergency Medicine

## 2020-11-14 DIAGNOSIS — Z794 Long term (current) use of insulin: Secondary | ICD-10-CM | POA: Insufficient documentation

## 2020-11-14 DIAGNOSIS — E1040 Type 1 diabetes mellitus with diabetic neuropathy, unspecified: Secondary | ICD-10-CM | POA: Insufficient documentation

## 2020-11-14 DIAGNOSIS — F17210 Nicotine dependence, cigarettes, uncomplicated: Secondary | ICD-10-CM | POA: Insufficient documentation

## 2020-11-14 DIAGNOSIS — E1065 Type 1 diabetes mellitus with hyperglycemia: Secondary | ICD-10-CM | POA: Insufficient documentation

## 2020-11-14 DIAGNOSIS — R739 Hyperglycemia, unspecified: Secondary | ICD-10-CM | POA: Diagnosis present

## 2020-11-14 LAB — CBG MONITORING, ED: Glucose-Capillary: >600 mg/dL (ref 70–99)

## 2020-11-14 LAB — CBC WITH DIFFERENTIAL/PLATELET
Abs Immature Granulocytes: 0.01 10*3/uL (ref 0.00–0.07)
Basophils Absolute: 0 10*3/uL (ref 0.0–0.1)
Basophils Relative: 1 %
Eosinophils Absolute: 0.1 10*3/uL (ref 0.0–0.5)
Eosinophils Relative: 1 %
HCT: 45 % (ref 39.0–52.0)
Hemoglobin: 15.6 g/dL (ref 13.0–17.0)
Immature Granulocytes: 0 %
Lymphocytes Relative: 31 %
Lymphs Abs: 1.5 10*3/uL (ref 0.7–4.0)
MCH: 30 pg (ref 26.0–34.0)
MCHC: 34.7 g/dL (ref 30.0–36.0)
MCV: 86.5 fL (ref 80.0–100.0)
Monocytes Absolute: 0.4 10*3/uL (ref 0.1–1.0)
Monocytes Relative: 8 %
Neutro Abs: 2.8 10*3/uL (ref 1.7–7.7)
Neutrophils Relative %: 59 %
Platelets: 318 10*3/uL (ref 150–400)
RBC: 5.2 MIL/uL (ref 4.22–5.81)
RDW: 12.1 % (ref 11.5–15.5)
WBC: 4.8 10*3/uL (ref 4.0–10.5)
nRBC: 0 % (ref 0.0–0.2)

## 2020-11-14 LAB — BASIC METABOLIC PANEL
Anion gap: 8 (ref 5–15)
BUN: 19 mg/dL (ref 6–20)
CO2: 28 mmol/L (ref 22–32)
Calcium: 9.2 mg/dL (ref 8.9–10.3)
Chloride: 89 mmol/L — ABNORMAL LOW (ref 98–111)
Creatinine, Ser: 1.06 mg/dL (ref 0.61–1.24)
GFR, Estimated: >60 mL/min (ref >60)
Glucose, Bld: 930 mg/dL (ref 70–99)
Potassium: 5.3 mmol/L — ABNORMAL HIGH (ref 3.5–5.1)
Sodium: 125 mmol/L — ABNORMAL LOW (ref 135–145)

## 2020-11-14 LAB — I-STAT VENOUS BLOOD GAS, ED
Acid-Base Excess: 4 mmol/L — ABNORMAL HIGH (ref 0.0–2.0)
Bicarbonate: 32.1 mmol/L — ABNORMAL HIGH (ref 20.0–28.0)
Calcium, Ion: 1.25 mmol/L (ref 1.15–1.40)
HCT: 48 % (ref 39.0–52.0)
Hemoglobin: 16.3 g/dL (ref 13.0–17.0)
O2 Saturation: 34 %
Potassium: 5.1 mmol/L (ref 3.5–5.1)
Sodium: 126 mmol/L — ABNORMAL LOW (ref 135–145)
TCO2: 34 mmol/L — ABNORMAL HIGH (ref 22–32)
pCO2, Ven: 58.4 mmHg (ref 44.0–60.0)
pH, Ven: 7.348 (ref 7.250–7.430)
pO2, Ven: 22 mmHg — CL (ref 32.0–45.0)

## 2020-11-14 LAB — BETA-HYDROXYBUTYRIC ACID: Beta-Hydroxybutyric Acid: 0.14 mmol/L (ref 0.05–0.27)

## 2020-11-14 MED ORDER — LACTATED RINGERS IV SOLN: INTRAVENOUS | Status: DC

## 2020-11-14 MED ORDER — INSULIN REGULAR(HUMAN) IN NACL 100-0.9 UT/100ML-% IV SOLN
INTRAVENOUS | Status: DC
  Filled 2020-11-14: qty 100

## 2020-11-14 MED ORDER — DEXTROSE IN LACTATED RINGERS 5 % IV SOLN: INTRAVENOUS | Status: DC

## 2020-11-14 MED ORDER — DEXTROSE 50 % IV SOLN: 0.0000 mL | INTRAVENOUS | Status: DC | PRN

## 2020-11-14 MED ORDER — SODIUM CHLORIDE 0.9 % IV BOLUS
1000.0000 mL | Freq: Once | INTRAVENOUS | Status: AC
  Administered 2020-11-14: 1000 mL via INTRAVENOUS

## 2020-11-14 NOTE — ED Provider Notes (Cosign Needed)
[name] Provider Note   CSN: [id number] Arrival date & time: 11/14/20  1758     History Chief Complaint  Patient presents with   Hyperglycemia    [name] is a 20 y.o. male.  HPI Patient is a 20 year old male with past medical history significant for DM1 on insulin long acting and short acting.  He states that he uses 32 units of Lantus nightly, states he uses sliding scale insulin however tells me that he has not had a glucometer in quite some time and is uncertain of how much insulin he has been using during the day, also with history of gastroparesis, reflux, bipolar  Patient presented today with complaints of fatigue, body aches states that his symptoms have been ongoing for the past 4 days and he states that his blood sugars been elevated however he states that he does not have a glucometer and has not been checking his blood sugar in fact he states he has been using sliding scale insulin NovoLog--although this was recently switched to Humalog--- but is unable to quantify how much he usually gives himself.  He states he has had some nausea and vomiting denies any diarrhea.  He states that he was feeling relatively well until the past 4 days.  Denies any chest pain or shortness of breath no lightheadedness or dizziness.  States he has been drinking lots lots of water and peeing frequently.  Denies any fever cough congestion shortness of breath.  No numbness or weakness or slurred speech.     Past Medical History:  Diagnosis Date   Abrasion of arm, right 08/22/2011   Bipolar 1 disorder (HCC)    Depression    Diabetes mellitus    Type I - insulin pump   Nasal fracture 08/13/2011   was hit in nose while playing basketball   Seizures ([place])    x 3 - due to low blood sugar - last time 5-6 yrs. 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)    Ketonuria 04/17/2014   Poorly controlled type 1 diabetes mellitus ([place]) 04/17/2014   Hyperglycemia due to type 1 diabetes mellitus ([place]) 03/18/2014   Diabetic ketoacidosis ([place]) 05/20/2013   Dehydration 05/06/2013   Non compliance with medical treatment 05/06/2013   DKA (diabetic ketoacidoses) ([place]) 05/05/2013   Diabetes ([place]) 03/25/2013   Mild Diabetic ketoacidosis 03/25/2013   Nausea with vomiting 03/25/2013   Adjustment reaction 12/05/2012   DKA, type 1 ([place]) 12/04/2012   Non compliance w medication regimen 11/21/2012   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[place]) 08/02/2012   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   Fracture, nasal 08/24/2011    Class: Acute   Goiter    Hypoglycemia associated with diabetes ([place]) 05/23/2011   Thyroiditis, 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/23/2011   Diabetic autonomic neuropathy ([place]) 05/23/2011   Tachycardia 05/23/2011   Type I (juvenile type) diabetes mellitus without mention of complication, uncontrolled 08/30/2010   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    Past Surgical History:  Procedure Laterality Date   APPENDECTOMY     CLOSED REDUCTION NASAL FRACTURE  08/24/2011   Procedure:  CLOSED REDUCTION NASAL FRACTURE;  Surgeon: [name] Belfast, MD;  Location: [facility];  Service: ENT;  Laterality: N/A;       Family History  Problem Relation Age of Onset   Heart disease Mother        MI at age 62   Diabetes Paternal Grandfather        type 2   Hypertension Paternal Grandfather    Heart disease Paternal Grandfather    Hepatitis C Brother        older brother (incarcerated)   Hypertension Father     Social History    Tobacco Use   Smoking status: Every Day    Packs/day: 1.00    Years: 6.00    Pack years: 6.00    Types: Cigarettes   Smokeless tobacco: Never  Vaping Use   Vaping Use: Never used  Substance Use Topics   Alcohol use: Not Currently   Drug use: Yes    Types: Marijuana    Home Medications Prior to Admission medications   Medication Sig Start Date End Date Taking? Authorizing Provider  Accu-Chek FastClix Lancets MISC CHECK SUGAR 10 X DAILY 06/25/15   [provider]  DEPAKOTE 250 MG DR tablet Take two tablets each evening. Patient taking differently: Take 500 mg by mouth every evening.  09/10/15 09/09/16  Sherrlyn Hock, MD  glucagon (GLUCAGON EMERGENCY) 1 MG injection Inject 1 mg into the vein once as needed (for hypoglycemia). Patient not taking: Reported on 11/22/2019 02/20/18   Sherrlyn Hock, MD  glucose blood (ACCU-CHEK GUIDE) test strip USE 6X DAILY TO CHECK GLUCOSE 07/24/20   Sherrlyn Hock, MD  insulin aspart (NOVOLOG FLEXPEN) 100 UNIT/ML FlexPen USE UP TO 50 UNITS DAILY. 07/24/20   Sherrlyn Hock, MD  insulin glargine (LANTUS SOLOSTAR) 100 UNIT/ML Solostar Pen Use up to 50 units daily 09/08/20   Sherrlyn Hock, MD  Insulin Pen Needle (BD PEN NEEDLE NANO U/F) 32G X 4 MM MISC USE TO INJECT INSULIN VIA INSULIN PEN 7 TIME A DAY Patient taking differently: 1 each by Other route See admin instructions. USE TO INJECT INSULIN VIA INSULIN PEN 7 TIME A DAY 05/28/18   Sherrlyn Hock, MD  Insulin Syringe-Needle U-100 31G X 5/16" 1 ML MISC Use with insulin vial 08/07/20   Sherrlyn Hock, MD    Allergies    Patient has no known allergies.  Review of Systems   Review of Systems  Constitutional:  Positive for activity change and fatigue. Negative for chills and fever.  HENT:  Negative for congestion.   Eyes:  Negative for pain.  Respiratory:  Negative for cough and shortness of breath.   Cardiovascular:  Negative for chest pain and leg swelling.   Gastrointestinal:  Positive for nausea. Negative for abdominal pain, diarrhea and vomiting.  Genitourinary:  Positive for frequency. Negative for dysuria.  Musculoskeletal:  Positive for myalgias.  Skin:  Negative for rash.  Neurological:  Negative for dizziness and headaches.   Physical Exam Updated Vital Signs BP (!) 165/104   Pulse (!) 58   Temp 98.3 F (36.8 C) (Oral)   Resp 20   SpO2 100%   Physical Exam Vitals and nursing note reviewed.  Constitutional:      Comments: Patient is 20 year old male appears fatigued somewhat anxious appearing, able answer questions appropriate follow commands, alert and oriented x3 is not appear toxic  HENT:     Head: Normocephalic and atraumatic.  Nose: Nose normal.     Mouth/Throat:     Mouth: Mucous membranes are dry.  Eyes:     General: No scleral icterus. Cardiovascular:     Rate and Rhythm: Normal rate and regular rhythm.     Pulses: Normal pulses.     Heart sounds: Normal heart sounds.  Pulmonary:     Effort: Pulmonary effort is normal. No respiratory distress.     Breath sounds: No wheezing.  Abdominal:     Palpations: Abdomen is soft.     Tenderness: There is no abdominal tenderness. There is no guarding or rebound.     Comments: Abdomen is soft nontender  Musculoskeletal:     Cervical back: Normal range of motion.     Right lower leg: No edema.     Left lower leg: No edema.  Skin:    General: Skin is warm and dry.     Capillary Refill: Capillary refill takes less than 2 seconds.  Neurological:     Mental Status: He is alert. Mental status is at baseline.  Psychiatric:        Mood and Affect: Mood normal.        Behavior: Behavior normal.    ED Results / Procedures / Treatments   Labs (all labs ordered are listed, but only abnormal results are displayed) Labs Reviewed  BASIC METABOLIC PANEL - Abnormal; Notable for the following components:      Result Value   Sodium 125 (*)    Potassium 5.3 (*)    Chloride 89  (*)    Glucose, Bld 930 (*)    All other components within normal limits  CBG MONITORING, ED - Abnormal; Notable for the following components:   Glucose-Capillary >600 (*)    All other components within normal limits  I-STAT VENOUS BLOOD GAS, ED - Abnormal; Notable for the following components:   pO2, Ven 22.0 (*)    Bicarbonate 32.1 (*)    TCO2 34 (*)    Acid-Base Excess 4.0 (*)    Sodium 126 (*)    All other components within normal limits  RESP PANEL BY RT-PCR (FLU A&B, COVID) ARPGX2  CBC WITH DIFFERENTIAL/PLATELET  BASIC METABOLIC PANEL  BASIC METABOLIC PANEL  BASIC METABOLIC PANEL  BETA-HYDROXYBUTYRIC ACID  BETA-HYDROXYBUTYRIC ACID  URINALYSIS, ROUTINE W REFLEX MICROSCOPIC  CBG MONITORING, ED    EKG None  Radiology DG Hand Complete Left  Result Date: 11/13/2020 CLINICAL DATA:  Hand pain Blunt trauma.  Chopping wood injury EXAM: LEFT HAND - COMPLETE 3+ VIEW COMPARISON:  None. FINDINGS: No evidence of fracture of the carpal or metacarpal bones. Radiocarpal joint is intact. Phalanges are normal. No soft tissue injury. IMPRESSION: No fracture or dislocation. Electronically Signed   By: Suzy Bouchard M.D.   On: 11/13/2020 14:13    Procedures Procedures   Medications Ordered in ED Medications  insulin regular, human (MYXREDLIN) 100 units/ 100 mL infusion (has no administration in time range)  lactated ringers infusion (has no administration in time range)  dextrose 5 % in lactated ringers infusion (has no administration in time range)  dextrose 50 % solution 0-50 mL (has no administration in time range)  sodium chloride 0.9 % bolus 1,000 mL (1,000 mLs Intravenous New Bag/Given 11/14/20 1823)    ED Course  I have reviewed the triage vital signs and the nursing notes.  Pertinent labs & imaging results that were available during my care of the patient were reviewed by me and considered in my  medical decision making (see chart for details).    MDM  Rules/Calculators/A&P                          Patient is a 20 year old male with history of DM1 rest of HPI  Patient presented for 4 days of fatigue, polyuria polydipsia some malaise myalgias and states he feels very dehydrated.  He denies any chest pain or shortness of breath no fevers.  Seems that he is a relatively poor historian when it comes to how much insulin he is using states he does not have a glucometer but also told provider yesterday that his blood sugars have been running high.  Seems that he is basing this off his symptoms.  Predominantly his fatigue malaise myalgias and polyuria polydipsia.  BMP notable for appropriate hyponatremia given his elevated blood sugar.  Blood sugar is 230.  He is on insulin drip currently and receiving fluids  I-STAT VBG with normal pH and bicarb on BMP unremarkable with no anion gap patient is not in DKA.  CBC without leukocytosis or anemia.  COVID test is pending at this time.  Patient is agreeable to admission.  Will place consult for admission. Oncoming team is aware of pt.   Final Clinical Impression(s) / ED Diagnoses Final diagnoses:  Hyperglycemia    Rx / DC Orders ED Discharge Orders     None        Tedd Sias, [place] 11/14/20 1930

## 2020-11-14 NOTE — ED Notes (Signed)
This RN and an EMT went to perform ordered COVID swab and meds, pt refused COVID swab and became agitated; pt began to get out of bed repeatedly saying "I'm leaving, I'm leaving"; pt refused to wait for EDP; EDP aware

## 2020-11-14 NOTE — ED Provider Notes (Signed)
Care assumed from [place] at shift change pending admission call, please see his note for full details, but in brief Rodney [name] is a 20 y.o. male who presents with hyperglycemia in the setting of type 1 diabetes and poor medication compliance he was seen for the same yesterday and noted to have blood sugar in the 900s but refused to stay for admission, reported that he plan to stay today, is having fatigue, polyuria and polydipsia.  Blood sugar again noted to be in the 900s today but fortunately no signs of DKA and patient is alert and mentating well.  Care signed out to me pending patient called for hyperglycemia, patient was started on insulin drip and IV fluids for rehydration.  Nursing staff went in to do COVID screening test patient refused and immediately pulled out his IV and walked out of the room.  He refused to wait to speak with a provider.   Jacqlyn Larsen, PA-C 11/14/20 1951    Drenda Freeze, MD 11/14/20 [phone]

## 2020-11-14 NOTE — ED Notes (Signed)
Patient here for hyperglycemia, seen here and left AMA yesterday for same, now worsening vomiting, patient states he intends to be more compliant with treatment today.  Patient does not check his blood sugar at home, states he does not have a meter.

## 2020-11-14 NOTE — ED Triage Notes (Signed)
Returns for hyperglycemia; sts he feels "like crap"; denies vomiting

## 2020-11-15 ENCOUNTER — Emergency Department (HOSPITAL_COMMUNITY): Payer: Medicaid Other

## 2020-11-15 ENCOUNTER — Emergency Department (HOSPITAL_COMMUNITY)
Admission: EM | Admit: 2020-11-15 | Discharge: 2020-11-15 | Discharge status: Against Medical Advice | Payer: Medicaid Other | Source: Home / Self Care | Attending: Emergency Medicine | Admitting: Emergency Medicine

## 2020-11-15 ENCOUNTER — Encounter (HOSPITAL_COMMUNITY): Payer: Self-pay | Admitting: Emergency Medicine

## 2020-11-15 ENCOUNTER — Emergency Department (HOSPITAL_COMMUNITY)
Admission: EM | Admit: 2020-11-15 | Discharge: 2020-11-15 | Discharge status: Against Medical Advice | Payer: Medicaid Other | Attending: Emergency Medicine | Admitting: Emergency Medicine

## 2020-11-15 DIAGNOSIS — R110 Nausea: Secondary | ICD-10-CM | POA: Insufficient documentation

## 2020-11-15 DIAGNOSIS — R0602 Shortness of breath: Secondary | ICD-10-CM | POA: Diagnosis not present

## 2020-11-15 DIAGNOSIS — Z794 Long term (current) use of insulin: Secondary | ICD-10-CM | POA: Diagnosis not present

## 2020-11-15 DIAGNOSIS — E10649 Type 1 diabetes mellitus with hypoglycemia without coma: Secondary | ICD-10-CM | POA: Insufficient documentation

## 2020-11-15 DIAGNOSIS — Z20822 Contact with and (suspected) exposure to covid-19: Secondary | ICD-10-CM | POA: Insufficient documentation

## 2020-11-15 DIAGNOSIS — E1042 Type 1 diabetes mellitus with diabetic polyneuropathy: Secondary | ICD-10-CM | POA: Diagnosis not present

## 2020-11-15 DIAGNOSIS — M79642 Pain in left hand: Secondary | ICD-10-CM | POA: Insufficient documentation

## 2020-11-15 DIAGNOSIS — R739 Hyperglycemia, unspecified: Secondary | ICD-10-CM

## 2020-11-15 DIAGNOSIS — F17210 Nicotine dependence, cigarettes, uncomplicated: Secondary | ICD-10-CM | POA: Insufficient documentation

## 2020-11-15 DIAGNOSIS — E1065 Type 1 diabetes mellitus with hyperglycemia: Secondary | ICD-10-CM | POA: Insufficient documentation

## 2020-11-15 DIAGNOSIS — E1010 Type 1 diabetes mellitus with ketoacidosis without coma: Secondary | ICD-10-CM | POA: Insufficient documentation

## 2020-11-15 DIAGNOSIS — R112 Nausea with vomiting, unspecified: Secondary | ICD-10-CM | POA: Diagnosis present

## 2020-11-15 LAB — CBC WITH DIFFERENTIAL/PLATELET
Abs Immature Granulocytes: 0.02 10*3/uL (ref 0.00–0.07)
Basophils Absolute: 0 10*3/uL (ref 0.0–0.1)
Basophils Relative: 1 %
Eosinophils Absolute: 0.1 10*3/uL (ref 0.0–0.5)
Eosinophils Relative: 1 %
HCT: 39.8 % (ref 39.0–52.0)
Hemoglobin: 13.7 g/dL (ref 13.0–17.0)
Immature Granulocytes: 0 %
Lymphocytes Relative: 22 %
Lymphs Abs: 1.5 10*3/uL (ref 0.7–4.0)
MCH: 29.3 pg (ref 26.0–34.0)
MCHC: 34.4 g/dL (ref 30.0–36.0)
MCV: 85.2 fL (ref 80.0–100.0)
Monocytes Absolute: 0.6 10*3/uL (ref 0.1–1.0)
Monocytes Relative: 8 %
Neutro Abs: 4.6 10*3/uL (ref 1.7–7.7)
Neutrophils Relative %: 68 %
Platelets: 287 10*3/uL (ref 150–400)
RBC: 4.67 MIL/uL (ref 4.22–5.81)
RDW: 11.9 % (ref 11.5–15.5)
WBC: 6.9 10*3/uL (ref 4.0–10.5)
nRBC: 0 % (ref 0.0–0.2)

## 2020-11-15 LAB — COMPREHENSIVE METABOLIC PANEL
ALT: 49 U/L — ABNORMAL HIGH (ref 0–44)
AST: 33 U/L (ref 15–41)
Albumin: 3.5 g/dL (ref 3.5–5.0)
Alkaline Phosphatase: 163 U/L — ABNORMAL HIGH (ref 38–126)
Anion gap: 9 (ref 5–15)
BUN: 18 mg/dL (ref 6–20)
CO2: 23 mmol/L (ref 22–32)
Calcium: 8.9 mg/dL (ref 8.9–10.3)
Chloride: 90 mmol/L — ABNORMAL LOW (ref 98–111)
Creatinine, Ser: 0.96 mg/dL (ref 0.61–1.24)
GFR, Estimated: >60 mL/min (ref >60)
Glucose, Bld: 762 mg/dL (ref 70–99)
Potassium: 4.6 mmol/L (ref 3.5–5.1)
Sodium: 122 mmol/L — ABNORMAL LOW (ref 135–145)
Total Bilirubin: 1.1 mg/dL (ref 0.3–1.2)
Total Protein: 6.3 g/dL — ABNORMAL LOW (ref 6.5–8.1)

## 2020-11-15 LAB — I-STAT VENOUS BLOOD GAS, ED
Acid-Base Excess: 4 mmol/L — ABNORMAL HIGH (ref 0.0–2.0)
Bicarbonate: 27.6 mmol/L (ref 20.0–28.0)
Calcium, Ion: 1.15 mmol/L (ref 1.15–1.40)
HCT: 43 % (ref 39.0–52.0)
Hemoglobin: 14.6 g/dL (ref 13.0–17.0)
O2 Saturation: 100 %
Potassium: 4.6 mmol/L (ref 3.5–5.1)
Sodium: 124 mmol/L — ABNORMAL LOW (ref 135–145)
TCO2: 29 mmol/L (ref 22–32)
pCO2, Ven: 38.2 mmHg — ABNORMAL LOW (ref 44.0–60.0)
pH, Ven: 7.467 — ABNORMAL HIGH (ref 7.250–7.430)
pO2, Ven: 170 mmHg — ABNORMAL HIGH (ref 32.0–45.0)

## 2020-11-15 LAB — LACTIC ACID, PLASMA: Lactic Acid, Venous: 1.2 mmol/L (ref 0.5–1.9)

## 2020-11-15 LAB — RESP PANEL BY RT-PCR (FLU A&B, COVID) ARPGX2
Influenza A by PCR: NEGATIVE
Influenza B by PCR: NEGATIVE
SARS Coronavirus 2 by RT PCR: NEGATIVE

## 2020-11-15 LAB — CBG MONITORING, ED
Glucose-Capillary: >600 mg/dL (ref 70–99)
Glucose-Capillary: >600 mg/dL (ref 70–99)

## 2020-11-15 LAB — BETA-HYDROXYBUTYRIC ACID: Beta-Hydroxybutyric Acid: 1.23 mmol/L — ABNORMAL HIGH (ref 0.05–0.27)

## 2020-11-15 MED ORDER — IBUPROFEN 800 MG PO TABS: 800.0000 mg | ORAL_TABLET | Freq: Once | ORAL | Status: DC

## 2020-11-15 MED ORDER — LACTATED RINGERS IV BOLUS: 1000.0000 mL | Freq: Once | INTRAVENOUS | Status: DC

## 2020-11-15 MED ORDER — LACTATED RINGERS IV BOLUS
2000.0000 mL | Freq: Once | INTRAVENOUS | Status: AC
  Administered 2020-11-15: 2000 mL via INTRAVENOUS

## 2020-11-15 NOTE — ED Triage Notes (Addendum)
Patient arrives accompanied by Father complaining of continuing hyperglycemia after leaving AMA multiple times. Patient states some shortness of breath, but is able to speak in full sentences. When this RN asked patient why he left before his admission, and if he would be willing to be admitted this time, patient states, "it depends on what bull shit you want to do." When asked to clarify, patient states, "stick shit up my nose." Informed patient that it is a system wide policy for patient's to be COVID swabbed before admission and it was non-negotiable if he would like to be admitted. Patient agreeable if this RN does the swab, swab completed.

## 2020-11-15 NOTE — ED Provider Notes (Addendum)
[facility] EMERGENCY DEPARTMENT Provider Note   CSN: [id number] Arrival date & time: 11/15/20  0623     History Chief Complaint  Patient presents with   Hyperglycemia    Rodney [name] is a 20 y.o. male.  Type I diabetic here with elevated blood sugar.  States has been elevated for the past 3 to 4 days.  He does feel short of breath.  He just left [facility], AMA several hours ago.  He cannot say why he did this.  Chart review shows he is left AMA 4 times over the past 3 days.  States he took his Lantus 32 units last night but has not been taking his NovoLog because he "has not been eating.  Denies any nausea or vomiting.  Denies any fevers, chills, chest pain but does feel short of breath.  No pain with urination or blood in the urine Denies suicidal thoughts or homicidal thoughts  The history is provided by the patient.  Hyperglycemia Associated symptoms: fatigue, nausea and shortness of breath   Associated symptoms: no abdominal pain, no chest pain, no dizziness, no dysuria, no fever, no vomiting and no weakness       Past Medical History:  Diagnosis Date   Abrasion of arm, right 08/22/2011   Bipolar 1 disorder ([place])    Depression    Diabetes mellitus    Type I - insulin pump   Nasal fracture 08/13/2011   was hit in nose while playing basketball   Seizures ([place])    x 3 - due to low blood sugar - last time 5-6 yrs. 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   GERD (gastroesophageal reflux disease) 09/15/2015   Unintentional weight loss 09/15/2015   Injury of right hand 07/28/2015   Diabetic ketoacidosis without coma associated with type 1 diabetes mellitus ([place])    Ketonuria 04/17/2014   Poorly controlled type 1 diabetes mellitus ([place]) 04/17/2014   Hyperglycemia due to type 1 diabetes mellitus ([place])  03/18/2014   Diabetic ketoacidosis ([place]) 05/20/2013   Dehydration 05/06/2013   Non compliance with medical treatment 05/06/2013   DKA (diabetic ketoacidoses) ([place]) 05/05/2013   Diabetes ([place]) 03/25/2013   Mild Diabetic ketoacidosis 03/25/2013   Nausea with vomiting 03/25/2013   Adjustment reaction 12/05/2012   DKA, type 1 ([place]) 12/04/2012   Non compliance w medication regimen 11/21/2012   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[place]) 08/02/2012   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   Fracture, nasal 08/24/2011    Class: Acute   Goiter    Hypoglycemia associated with diabetes ([place]) 05/23/2011   Thyroiditis, 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/23/2011   Diabetic autonomic neuropathy ([place]) 05/23/2011   Tachycardia 05/23/2011   Type I (juvenile type) diabetes mellitus without mention of complication, uncontrolled 08/30/2010   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    Past Surgical History:  Procedure Laterality Date   APPENDECTOMY     CLOSED REDUCTION NASAL FRACTURE  08/24/2011   Procedure: CLOSED REDUCTION NASAL FRACTURE;  Surgeon: [name] Belfast, MD;  Location: [facility];  Service: ENT;  Laterality: N/A;       Family History  Problem Relation Age of Onset   Heart disease Mother        MI at age 8   Diabetes Paternal Grandfather  type 2   Hypertension Paternal Grandfather    Heart disease Paternal Grandfather    Hepatitis C Brother        older brother (incarcerated)   Hypertension Father     Social History   Tobacco Use   Smoking status: Every Day    Packs/day: 1.00    Years: 6.00    Pack years: 6.00    Types: Cigarettes   Smokeless tobacco: Never  Vaping Use   Vaping Use: Never used  Substance Use Topics   Alcohol use: Not Currently   Drug use: Yes    Types: Marijuana    Home Medications Prior to Admission medications   Medication Sig Start Date End Date Taking? Authorizing Provider   Accu-Chek FastClix Lancets MISC CHECK SUGAR 10 X DAILY 06/25/15   [provider]  DEPAKOTE 250 MG DR tablet Take two tablets each evening. Patient taking differently: Take 500 mg by mouth every evening.  09/10/15 09/09/16  Sherrlyn Hock, MD  glucagon (GLUCAGON EMERGENCY) 1 MG injection Inject 1 mg into the vein once as needed (for hypoglycemia). Patient not taking: Reported on 11/22/2019 02/20/18   Sherrlyn Hock, MD  glucose blood (ACCU-CHEK GUIDE) test strip USE 6X DAILY TO CHECK GLUCOSE 07/24/20   Sherrlyn Hock, MD  insulin aspart (NOVOLOG FLEXPEN) 100 UNIT/ML FlexPen USE UP TO 50 UNITS DAILY. 07/24/20   Sherrlyn Hock, MD  insulin glargine (LANTUS SOLOSTAR) 100 UNIT/ML Solostar Pen Use up to 50 units daily 09/08/20   Sherrlyn Hock, MD  Insulin Pen Needle (BD PEN NEEDLE NANO U/F) 32G X 4 MM MISC USE TO INJECT INSULIN VIA INSULIN PEN 7 TIME A DAY Patient taking differently: 1 each by Other route See admin instructions. USE TO INJECT INSULIN VIA INSULIN PEN 7 TIME A DAY 05/28/18   Sherrlyn Hock, MD  Insulin Syringe-Needle U-100 31G X 5/16" 1 ML MISC Use with insulin vial 08/07/20   Sherrlyn Hock, MD    Allergies    Patient has no known allergies.  Review of Systems   Review of Systems  Constitutional:  Positive for activity change, appetite change and fatigue. Negative for fever.  HENT:  Negative for congestion.   Eyes:  Negative for visual disturbance.  Respiratory:  Positive for shortness of breath. Negative for cough and chest tightness.   Cardiovascular:  Negative for chest pain.  Gastrointestinal:  Positive for nausea. Negative for abdominal pain and vomiting.  Genitourinary:  Negative for dysuria and hematuria.  Musculoskeletal:  Negative for arthralgias and myalgias.  Skin:  Negative for rash.  Neurological:  Negative for dizziness, weakness and headaches.   all other systems are negative except as noted in the HPI and PMH.   Physical  Exam Updated Vital Signs BP (!) 151/97   Pulse 67   Temp 98.6 F (37 C)   Resp 17   Ht 5\' 6"  (1.676 m)   Wt 55.3 kg   SpO2 100%   BMI 19.69 kg/m   Physical Exam Vitals and nursing note reviewed.  Constitutional:      General: He is not in acute distress.    Appearance: He is well-developed.  HENT:     Head: Normocephalic and atraumatic.     Mouth/Throat:     Mouth: Mucous membranes are moist.     Pharynx: No oropharyngeal exudate.  Eyes:     Conjunctiva/sclera: Conjunctivae normal.     Pupils: Pupils are equal, round, and reactive to light.  Neck:     Comments: No meningismus. Cardiovascular:     Rate and Rhythm: Normal rate and regular rhythm.     Heart sounds: Normal heart sounds. No murmur heard. Pulmonary:     Effort: Pulmonary effort is normal. No respiratory distress.     Breath sounds: Normal breath sounds.  Abdominal:     Palpations: Abdomen is soft.     Tenderness: There is no abdominal tenderness. There is no guarding or rebound.  Musculoskeletal:        General: No tenderness. Normal range of motion.     Cervical back: Normal range of motion and neck supple.  Skin:    General: Skin is warm.  Neurological:     Mental Status: He is alert and oriented to person, place, and time.     Cranial Nerves: No cranial nerve deficit.     Motor: No abnormal muscle tone.     Coordination: Coordination normal.     Comments:  5/5 strength throughout. CN 2-12 intact.Equal grip strength.   Psychiatric:        Behavior: Behavior normal.    ED Results / Procedures / Treatments   Labs (all labs ordered are listed, but only abnormal results are displayed) Labs Reviewed  CBG MONITORING, ED - Abnormal; Notable for the following components:      Result Value   Glucose-Capillary >600 (*)    All other components within normal limits  CBC WITH DIFFERENTIAL/PLATELET  COMPREHENSIVE METABOLIC PANEL  LACTIC ACID, PLASMA  LACTIC ACID, PLASMA  BETA-HYDROXYBUTYRIC ACID  I-STAT  VENOUS BLOOD GAS, ED    EKG None  Radiology DG Chest Portable 1 View  Result Date: 11/15/2020 CLINICAL DATA:  20 year old male with shortness of breath. Hyperglycemia. EXAM: PORTABLE CHEST 1 VIEW COMPARISON:  Chest radiographs 06/13/2018 and earlier. FINDINGS: Portable AP semi upright view at 0610 hours. Normal lung volumes and mediastinal contours. Visualized tracheal air column is within normal limits. Allowing for portable technique the lungs are clear. No pneumothorax or pleural effusion. No osseous abnormality identified. Negative visible bowel gas. IMPRESSION: Negative portable chest. Electronically Signed   By: Genevie Ann M.D.   On: 11/15/2020 06:29   DG Hand Complete Left  Result Date: 11/13/2020 CLINICAL DATA:  Hand pain Blunt trauma.  Chopping wood injury EXAM: LEFT HAND - COMPLETE 3+ VIEW COMPARISON:  None. FINDINGS: No evidence of fracture of the carpal or metacarpal bones. Radiocarpal joint is intact. Phalanges are normal. No soft tissue injury. IMPRESSION: No fracture or dislocation. Electronically Signed   By: Suzy Bouchard M.D.   On: 11/13/2020 14:13    Procedures .Critical Care  Date/Time: 11/30/2020 9:48 AM Performed by: Ezequiel Essex, MD Authorized by: Ezequiel Essex, MD   Critical care provider statement:    Critical care time (minutes):  40   Critical care was necessary to treat or prevent imminent or life-threatening deterioration of the following conditions:  Endocrine crisis and dehydration   Critical care was time spent personally by me on the following activities:  Discussions with consultants, evaluation of patient's response to treatment, examination of patient, ordering and performing treatments and interventions, ordering and review of laboratory studies, ordering and review of radiographic studies, pulse oximetry, re-evaluation of patient's condition, obtaining history from patient or surrogate and review of old charts   Medications Ordered in  ED Medications  lactated ringers bolus 2,000 mL (has no administration in time range)    ED Course  I have reviewed the triage vital signs and  the nursing notes.  Pertinent labs & imaging results that were available during my care of the patient were reviewed by me and considered in my medical decision making (see chart for details).    MDM Rules/Calculators/A&P                         Diabetic with hyperglycemia and recent episodes of leaving AMA.  Stable vitals.  No distress  Labs and IV fluids are ordered.  Shortly after arrival patient demanding to leave.  States he wants to go home and has "shit to do".  He is alert and oriented x3.  He is not suicidal or homicidal.  Denies any thoughts of wanting to hurt himself. Discussed with patient that high blood sugar can be life-threatening.  Patient refusing to listen and states he just wants to leave. He appears to have capacity to refuse treatment will leave AMA.  Labs still pending.  Patient's father at bedside unable to convince him to stay either.  Patient denies feeling suicidal but cannot give a reason why he wants to leave.  He appears to have capacity to make medical decisions and will leave [facility]. Final Clinical Impression(s) / ED Diagnoses Final diagnoses:  Hyperglycemia    Rx / DC Orders ED Discharge Orders     None        Elmer Merwin, Annie Main, MD 11/15/20 [id number]    Ezequiel Essex, MD 11/30/20 [phone]

## 2020-11-15 NOTE — ED Provider Notes (Signed)
[facility] DEPT Provider Note   CSN: [id number] Arrival date & time: 11/15/20  0246     History Chief Complaint  Patient presents with   Hyperglycemia    Rodney [name] is a 20 y.o. male.  Patient with hx of type 1 DM on insulin injections presents to the ED with a chief complaint of hyperglycemia.  Seen yesterday for the same and declined admission.  Patient reports associated nausea and vomiting.  He denies any fevers or chills.  Denies any recent illnesses.  Also complains of left hand pain.  The history is provided by the patient. No language interpreter was used.      Past Medical History:  Diagnosis Date   Abrasion of arm, right 08/22/2011   Bipolar 1 disorder ([facility])    Depression    Diabetes mellitus    Type I - insulin pump   Nasal fracture 08/13/2011   was hit in nose while playing basketball   Seizures ([place])    x 3 - due to low blood sugar - last time 5-6 yrs. 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   GERD (gastroesophageal reflux disease) 09/15/2015   Unintentional weight loss 09/15/2015   Injury of right hand 07/28/2015   Diabetic ketoacidosis without coma associated with type 1 diabetes mellitus ([place])    Ketonuria 04/17/2014   Poorly controlled type 1 diabetes mellitus ([place]) 04/17/2014   Hyperglycemia due to type 1 diabetes mellitus ([place]) 03/18/2014   Diabetic ketoacidosis ([place]) 05/20/2013   Dehydration 05/06/2013   Non compliance with medical treatment 05/06/2013   DKA (diabetic ketoacidoses) ([place]) 05/05/2013   Diabetes ([place]) 03/25/2013   Mild Diabetic ketoacidosis 03/25/2013   Nausea with vomiting 03/25/2013   Adjustment reaction 12/05/2012   DKA, type 1 ([place]) 12/04/2012   Non compliance w medication regimen 11/21/2012   Diabetic peripheral neuropathy associated with type 1  diabetes mellitus ([place]) 08/02/2012   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   Fracture, nasal 08/24/2011    Class: Acute   Goiter    Hypoglycemia associated with diabetes ([place]) 05/23/2011   Thyroiditis, 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/23/2011   Diabetic autonomic neuropathy ([place]) 05/23/2011   Tachycardia 05/23/2011   Type I (juvenile type) diabetes mellitus without mention of complication, uncontrolled 08/30/2010   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    Past Surgical History:  Procedure Laterality Date   APPENDECTOMY     CLOSED REDUCTION NASAL FRACTURE  08/24/2011   Procedure: CLOSED REDUCTION NASAL FRACTURE;  Surgeon: [name] Belfast, MD;  Location: [facility];  Service: ENT;  Laterality: N/A;       Family History  Problem Relation Age of Onset   Heart disease Mother        MI at age 21   Diabetes Paternal Grandfather        type 2   Hypertension Paternal Grandfather    Heart disease Paternal Grandfather    Hepatitis C Brother        older brother (incarcerated)   Hypertension Father     Social History   Tobacco Use   Smoking status: Every Day    Packs/day: 1.00    Years: 6.00    Pack years: 6.00    Types: Cigarettes   Smokeless tobacco: Never  Vaping Use   Vaping Use: Never used  Substance Use  Topics   Alcohol use: Not Currently   Drug use: Yes    Types: Marijuana    Home Medications Prior to Admission medications   Medication Sig Start Date End Date Taking? Authorizing Provider  Accu-Chek FastClix Lancets MISC CHECK SUGAR 10 X DAILY 06/25/15   [provider]  DEPAKOTE 250 MG DR tablet Take two tablets each evening. Patient taking differently: Take 500 mg by mouth every evening.  09/10/15 09/09/16  Sherrlyn Hock, MD  glucagon (GLUCAGON EMERGENCY) 1 MG injection Inject 1 mg into the vein once as needed (for hypoglycemia). Patient not taking: Reported on 11/22/2019 02/20/18   Sherrlyn Hock, MD  glucose blood  (ACCU-CHEK GUIDE) test strip USE 6X DAILY TO CHECK GLUCOSE 07/24/20   Sherrlyn Hock, MD  insulin aspart (NOVOLOG FLEXPEN) 100 UNIT/ML FlexPen USE UP TO 50 UNITS DAILY. 07/24/20   Sherrlyn Hock, MD  insulin glargine (LANTUS SOLOSTAR) 100 UNIT/ML Solostar Pen Use up to 50 units daily 09/08/20   Sherrlyn Hock, MD  Insulin Pen Needle (BD PEN NEEDLE NANO U/F) 32G X 4 MM MISC USE TO INJECT INSULIN VIA INSULIN PEN 7 TIME A DAY Patient taking differently: 1 each by Other route See admin instructions. USE TO INJECT INSULIN VIA INSULIN PEN 7 TIME A DAY 05/28/18   Sherrlyn Hock, MD  Insulin Syringe-Needle U-100 31G X 5/16" 1 ML MISC Use with insulin vial 08/07/20   Sherrlyn Hock, MD    Allergies    Patient has no known allergies.  Review of Systems   Review of Systems  All other systems reviewed and are negative.  Physical Exam Updated Vital Signs BP (!) 151/91   Pulse 72   Temp 98.2 F (36.8 C) (Oral)   Resp 14   SpO2 100%   Physical Exam Vitals and nursing note reviewed.  Constitutional:      Appearance: He is well-developed.  HENT:     Head: Normocephalic and atraumatic.  Eyes:     Conjunctiva/sclera: Conjunctivae normal.  Cardiovascular:     Rate and Rhythm: Normal rate and regular rhythm.     Heart sounds: No murmur heard. Pulmonary:     Effort: Pulmonary effort is normal. No respiratory distress.     Breath sounds: Normal breath sounds.  Abdominal:     Palpations: Abdomen is soft.     Tenderness: There is no abdominal tenderness.  Musculoskeletal:        General: Normal range of motion.     Cervical back: Neck supple.  Skin:    General: Skin is warm and dry.  Neurological:     Mental Status: He is alert and oriented to person, place, and time.  Psychiatric:        Mood and Affect: Mood normal.        Behavior: Behavior normal.    ED Results / Procedures / Treatments   Labs (all labs ordered are listed, but only abnormal results are displayed) Labs  Reviewed  CBG MONITORING, ED - Abnormal; Notable for the following components:      Result Value   Glucose-Capillary >600 (*)    All other components within normal limits  RESP PANEL BY RT-PCR (FLU A&B, COVID) ARPGX2  CBC WITH DIFFERENTIAL/PLATELET  COMPREHENSIVE METABOLIC PANEL  BLOOD GAS, VENOUS    EKG None  Radiology DG Hand Complete Left  Result Date: 11/13/2020 CLINICAL DATA:  Hand pain Blunt trauma.  Chopping wood injury EXAM: LEFT HAND - COMPLETE 3+  VIEW COMPARISON:  None. FINDINGS: No evidence of fracture of the carpal or metacarpal bones. Radiocarpal joint is intact. Phalanges are normal. No soft tissue injury. IMPRESSION: No fracture or dislocation. Electronically Signed   By: Suzy Bouchard M.D.   On: 11/13/2020 14:13    Procedures Procedures   Medications Ordered in ED Medications  lactated ringers bolus 1,000 mL (has no administration in time range)  ibuprofen (ADVIL) tablet 800 mg (has no administration in time range)    ED Course  I have reviewed the triage vital signs and the nursing notes.  Pertinent labs & imaging results that were available during my care of the patient were reviewed by me and considered in my medical decision making (see chart for details).    MDM Rules/Calculators/A&P                          Patient is type 1 diabetic, here with hyperglycemia.  Will check labs, give fluids, and reassess.  3:35 AM Notified by RN that the patient has eloped. Final Clinical Impression(s) / ED Diagnoses Final diagnoses:  Hyperglycemia    Rx / DC Orders ED Discharge Orders     None        Montine Circle, PA-C 11/15/20 0338    [place], [name], MD 11/15/20 [phone]

## 2020-11-15 NOTE — ED Triage Notes (Signed)
BIB [facility] from home. Was initially called out for Difficulty breathing. Diabetic. Sugar has been reading high for past several days. Was seen at [facility] this AM and left AMA.  5106ml NS. A&Ox4.   18 left AC 148/86 64 heart rate 100% Room air CBG high.

## 2020-11-15 NOTE — ED Notes (Signed)
Patient noted to be walking out of department. Attempted to ask patient where he was going, but patient would not acknowledge or speak to this RN. Patient's father following him out stating that he is leaving. Father apologized to this RN for the "inconvenience."

## 2020-11-15 NOTE — ED Notes (Signed)
Pt glucose >600, RN notified

## 2020-11-15 NOTE — ED Notes (Signed)
Pt father came out to nurses station saying that the pt states that he want to leave AMA. MD made aware and at bedside. Pt states that he just wants to go home and wants staff to take out his IV. MD asked pt multiple times what is the reason that he wanted to leave. Pt would not give an answer. MD tells pt that his glucose is very high and is concerning. MD explained the risk of leaving the hospital and the benefits if he stayed. Pt started to get aggravated and just states "I don't know why you keep asking me questions, I told you I just want to leave. Take this thing out of my arm." MD states that we cannot keep him here but we highly suggest he stays. Pt refuses to stay. Pt signed AMA form. IV removed and pt left with father.

## 2020-12-04 ENCOUNTER — Emergency Department (HOSPITAL_COMMUNITY)
Admission: EM | Admit: 2020-12-04 | Discharge: 2020-12-04 | Disposition: A | Discharge status: Home / Self Care | Payer: Medicaid Other | Attending: Emergency Medicine | Admitting: Emergency Medicine

## 2020-12-04 ENCOUNTER — Encounter (HOSPITAL_COMMUNITY): Payer: Self-pay

## 2020-12-04 ENCOUNTER — Other Ambulatory Visit: Payer: Self-pay

## 2020-12-04 ENCOUNTER — Emergency Department (HOSPITAL_COMMUNITY): Payer: Medicaid Other

## 2020-12-04 DIAGNOSIS — S61011A Laceration without foreign body of right thumb without damage to nail, initial encounter: Secondary | ICD-10-CM | POA: Diagnosis not present

## 2020-12-04 DIAGNOSIS — F17210 Nicotine dependence, cigarettes, uncomplicated: Secondary | ICD-10-CM | POA: Diagnosis not present

## 2020-12-04 DIAGNOSIS — Z794 Long term (current) use of insulin: Secondary | ICD-10-CM | POA: Insufficient documentation

## 2020-12-04 DIAGNOSIS — S51812A Laceration without foreign body of left forearm, initial encounter: Secondary | ICD-10-CM | POA: Insufficient documentation

## 2020-12-04 DIAGNOSIS — E1010 Type 1 diabetes mellitus with ketoacidosis without coma: Secondary | ICD-10-CM | POA: Diagnosis not present

## 2020-12-04 DIAGNOSIS — W540XXA Bitten by dog, initial encounter: Secondary | ICD-10-CM | POA: Diagnosis not present

## 2020-12-04 DIAGNOSIS — Z23 Encounter for immunization: Secondary | ICD-10-CM | POA: Diagnosis not present

## 2020-12-04 DIAGNOSIS — E1040 Type 1 diabetes mellitus with diabetic neuropathy, unspecified: Secondary | ICD-10-CM | POA: Diagnosis not present

## 2020-12-04 MED ORDER — ACETAMINOPHEN 500 MG PO TABS
1000.0000 mg | ORAL_TABLET | Freq: Once | ORAL | Status: AC
  Administered 2020-12-04: 1000 mg via ORAL
  Filled 2020-12-04: qty 2

## 2020-12-04 MED ORDER — OXYCODONE HCL 5 MG PO TABS
5.0000 mg | ORAL_TABLET | Freq: Once | ORAL | Status: AC
Start: 2020-12-04 — End: 2020-12-04
  Administered 2020-12-04: 5 mg via ORAL
  Filled 2020-12-04: qty 1

## 2020-12-04 MED ORDER — IBUPROFEN 400 MG PO TABS
400.0000 mg | ORAL_TABLET | Freq: Once | ORAL | Status: AC
  Administered 2020-12-04: 400 mg via ORAL
  Filled 2020-12-04: qty 1

## 2020-12-04 MED ORDER — LIDOCAINE HCL (PF) 1 % IJ SOLN
10.0000 mL | Freq: Once | INTRAMUSCULAR | Status: AC
  Filled 2020-12-04: qty 10

## 2020-12-04 MED ORDER — LIDOCAINE HCL (PF) 1 % IJ SOLN
INTRAMUSCULAR | Status: AC
  Administered 2020-12-04: 10 mL
  Filled 2020-12-04: qty 10

## 2020-12-04 MED ORDER — AMOXICILLIN-POT CLAVULANATE 875-125 MG PO TABS: 1.0000 | ORAL_TABLET | Freq: Two times a day (BID) | ORAL | 0 refills | Status: DC

## 2020-12-04 MED ORDER — FENTANYL CITRATE (PF) 100 MCG/2ML IJ SOLN
50.0000 ug | Freq: Once | INTRAMUSCULAR | Status: AC
  Administered 2020-12-04: 50 ug via INTRAVENOUS
  Filled 2020-12-04: qty 2

## 2020-12-04 MED ORDER — TETANUS-DIPHTH-ACELL PERTUSSIS 5-2.5-18.5 LF-MCG/0.5 IM SUSY
0.5000 mL | PREFILLED_SYRINGE | Freq: Once | INTRAMUSCULAR | Status: AC
  Administered 2020-12-04: 0.5 mL via INTRAMUSCULAR
  Filled 2020-12-04: qty 0.5

## 2020-12-04 NOTE — Discharge Instructions (Signed)
Dog bites are difficult to close away, I loosely approximated your skin to promote healing but they are not completely sealed shut so that we do not trap too much bacteria inside.  I have prescribed an antibiotic that you should take to avoid infection.  Please follow-up with your primary care doctor.  Please call the orthopedic surgery group whose phone number to put in your paperwork to have a follow-up to check on your wound infection.  With diabetes, you are high risk for infection and should be very careful.  Please go see a doctor if you have significant redness, swelling, worsening pain.

## 2020-12-04 NOTE — ED Triage Notes (Signed)
Pt brought in by [facility] d/t dog bite on his left forearm and both hands. Pt verbal & reported 10/10 pain. Dressings are around the bites upon arrival & large amount of blood loss reported on scene, EMS stopped the bleeding while en route.

## 2020-12-04 NOTE — ED Notes (Signed)
Father at bedside.

## 2020-12-04 NOTE — ED Notes (Signed)
Ortho at bedside.

## 2020-12-04 NOTE — ED Notes (Addendum)
This RN cleaned pt's wounds on his arms/hands. Many black dog hairs were removed from multiple cuts, pt tol this well.

## 2020-12-04 NOTE — ED Notes (Signed)
All stitches were covered and dressed per EDP request before D/C.

## 2020-12-04 NOTE — ED Provider Notes (Signed)
[facility] DEPART[facility] Provider Note   CSN: [id number] Arrival date & time: 12/04/20  1357     History Chief Complaint  Patient presents with   Animal Bite    Rodney [name] is a 20 y.o. male.   Animal Bite Associated symptoms: no fever and no rash     20 yo M with type 1 DM presenting to the ED after dog bite. Patient reports he was bitten on both arms by neighbors dog. Pain is on BL arms, sharp, aching, constant, worsening by moving his digits. No therapies tried at home. He believes the dog is UTD on all shots but can easily be monitored.  Past Medical History:  Diagnosis Date   Abrasion of arm, right 08/22/2011   Bipolar 1 disorder (HCC)    Depression    Diabetes mellitus    Type I - insulin pump   Nasal fracture 08/13/2011   was hit in nose while playing basketball   Seizures ([place])    x 3 - due to low blood sugar - last time 5-6 yrs. 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   GERD (gastroesophageal reflux disease) 09/15/2015   Unintentional weight loss 09/15/2015   Injury of right hand 07/28/2015   Diabetic ketoacidosis without coma associated with type 1 diabetes mellitus ([place])    Ketonuria 04/17/2014   Poorly controlled type 1 diabetes mellitus ([place]) 04/17/2014   Hyperglycemia due to type 1 diabetes mellitus ([place]) 03/18/2014   Diabetic ketoacidosis ([place]) 05/20/2013   Dehydration 05/06/2013   Non compliance with medical treatment 05/06/2013   DKA (diabetic ketoacidoses) ([place]) 05/05/2013   Diabetes ([place]) 03/25/2013   Mild Diabetic ketoacidosis 03/25/2013   Nausea with vomiting 03/25/2013   Adjustment reaction 12/05/2012   DKA, type 1 ([place]) 12/04/2012   Non compliance w medication regimen 11/21/2012   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[place]) 08/02/2012    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   Fracture, nasal 08/24/2011    Class: Acute   Goiter    Hypoglycemia associated with diabetes ([place]) 05/23/2011   Thyroiditis, 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/23/2011   Diabetic autonomic neuropathy ([place]) 05/23/2011   Tachycardia 05/23/2011   Type I (juvenile type) diabetes mellitus without mention of complication, uncontrolled 08/30/2010   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    Past Surgical History:  Procedure Laterality Date   APPENDECTOMY     CLOSED REDUCTION NASAL FRACTURE  08/24/2011   Procedure: CLOSED REDUCTION NASAL FRACTURE;  Surgeon: [name] Belfast, MD;  Location: [facility];  Service: ENT;  Laterality: N/A;       Family History  Problem Relation Age of Onset   Heart disease Mother        MI at age 2   Diabetes Paternal Grandfather        type 2   Hypertension Paternal Grandfather    Heart disease Paternal Grandfather    Hepatitis C Brother        older brother (incarcerated)   Hypertension Father     Social History   Tobacco Use   Smoking status: Every Day    Packs/day: 1.00    Years: 6.00    Pack years: 6.00    Types: Cigarettes   Smokeless tobacco: Never  Vaping Use   Vaping Use: Never used  Substance  Use Topics   Alcohol use: Not Currently   Drug use: Yes    Types: Marijuana    Home Medications Prior to Admission medications   Medication Sig Start Date End Date Taking? Authorizing Provider  acetaminophen (TYLENOL) 500 MG tablet Take 1,000-1,500 mg by mouth every 6 (six) hours as needed for headache or mild pain.   Yes [provider]  amoxicillin-clavulanate (AUGMENTIN) 875-125 MG tablet Take 1 tablet by mouth every 12 (twelve) hours. 12/04/20  Yes Claud Kelp, MD  glucagon (GLUCAGON EMERGENCY) 1 MG injection Inject 1 mg into the vein once as needed (for hypoglycemia). 02/20/18  Yes Sherrlyn Hock, MD  ibuprofen (ADVIL) 200 MG tablet Take 400-600 mg by mouth  every 6 (six) hours as needed for headache or mild pain.   Yes [provider]  insulin aspart (NOVOLOG FLEXPEN) 100 UNIT/ML FlexPen USE UP TO 50 UNITS DAILY. Patient taking differently: Inject 1-15 Units into the skin 3 (three) times daily with meals. Per sliding scale 07/24/20  Yes Sherrlyn Hock, MD  Accu-Chek FastClix Lancets MISC CHECK SUGAR 10 X DAILY 06/25/15   [provider]  glucose blood (ACCU-CHEK GUIDE) test strip USE 6X DAILY TO CHECK GLUCOSE 07/24/20   Sherrlyn Hock, MD  insulin glargine (LANTUS SOLOSTAR) 100 UNIT/ML Solostar Pen Use up to 50 units daily Patient taking differently: Inject 32 Units into the skin at bedtime. 09/08/20   Sherrlyn Hock, MD  Insulin Pen Needle (BD PEN NEEDLE NANO U/F) 32G X 4 MM MISC USE TO INJECT INSULIN VIA INSULIN PEN 7 TIME A DAY Patient taking differently: 1 each by Other route See admin instructions. USE TO INJECT INSULIN VIA INSULIN PEN 7 TIME A DAY 05/28/18   Sherrlyn Hock, MD  Insulin Syringe-Needle U-100 31G X 5/16" 1 ML MISC Use with insulin vial 08/07/20   Sherrlyn Hock, MD    Allergies    Patient has no known allergies.  Review of Systems   Review of Systems  Constitutional:  Negative for chills and fever.  HENT:  Negative for ear pain and sore throat.   Eyes:  Negative for pain and visual disturbance.  Respiratory:  Negative for cough and shortness of breath.   Cardiovascular:  Negative for chest pain and palpitations.  Gastrointestinal:  Negative for abdominal pain and vomiting.  Genitourinary:  Negative for dysuria and hematuria.  Musculoskeletal:  Negative for arthralgias and back pain.  Skin:  Positive for wound. Negative for color change and rash.  Neurological:  Negative for seizures and syncope.  All other systems reviewed and are negative.  Physical Exam Updated Vital Signs BP (!) 141/94   Pulse (!) 102   Resp 12   SpO2 98%   Physical Exam Vitals and nursing note reviewed.   Constitutional:      General: He is not in acute distress.    Appearance: Normal appearance. He is well-developed. He is not ill-appearing or toxic-appearing.  HENT:     Head: Normocephalic and atraumatic.  Eyes:     Conjunctiva/sclera: Conjunctivae normal.  Cardiovascular:     Rate and Rhythm: Normal rate and regular rhythm.     Heart sounds: No murmur heard. Pulmonary:     Effort: Pulmonary effort is normal. No respiratory distress.     Breath sounds: Normal breath sounds.  Abdominal:     Palpations: Abdomen is soft.     Tenderness: There is no abdominal tenderness.  Musculoskeletal:     Cervical back:  Neck supple.  Skin:    General: Skin is warm and dry.     Comments: Significant and scattered lacerations and abrasions to left lower arm. 4 separate 3 cm lacerations to left forearm. No lacerations to left hand, only abrasions. Right hand has deep laceration to right 1st digit just distal to the MCP. 2+ radial pulses BL. Right first digit has excellent cap refill, full flexion and extension intact in right first digit when all joints are isolated.   Neurological:     Mental Status: He is alert.    ED Results / Procedures / Treatments   Labs (all labs ordered are listed, but only abnormal results are displayed) Labs Reviewed - No data to display  EKG None  Radiology DG Forearm Left  Result Date: 12/04/2020 CLINICAL DATA:  Dog bite EXAM: LEFT FOREARM - 2 VIEW COMPARISON:  02/08/2012 FINDINGS: No evidence of acute fracture of the left radius or ulna. No malalignment. Extensive soft tissue irregularity with numerous foci of air throughout the soft tissues primarily along the volar aspect of the mid to distal forearm. Faint 5 mm radiodensity within the soft tissues along the radial aspect of the distal forearm at the level of the distal radial diaphysis is nonspecific but could reflect a foreign body. IMPRESSION: 1. No evidence of acute fracture or malalignment of the left forearm.  2. Extensive soft tissue irregularity with numerous foci of air throughout the soft tissues primarily along the volar aspect of the mid to distal forearm compatible with history of penetrating trauma/animal bite. 3. Faint 5 mm radiodensity within the soft tissues along the radial aspect of the distal forearm is nonspecific but could reflect a foreign body. Electronically Signed   By: Davina Poke D.O.   On: 12/04/2020 15:19   DG Hand 2 View Right  Result Date: 12/04/2020 CLINICAL DATA:  Same-day hand radiograph EXAM: RIGHT HAND - 2 VIEW COMPARISON:  None. FINDINGS: There is no evidence of acute fracture. There is no dislocation. There are multiple soft tissue defects involving the index finger, middle finger, and ring finger. IMPRESSION: Multiple soft tissue defects/lacerations. No evidence of acute fracture or radiopaque foreign body. Electronically Signed   By: Maurine Simmering   On: 12/04/2020 16:25   DG Hand Complete Left  Result Date: 12/04/2020 CLINICAL DATA:  Dog bite EXAM: LEFT HAND - COMPLETE 3+ VIEW COMPARISON:  None. FINDINGS: There is no evidence of acute fracture or dislocation. Old healed fracture deformity of the fifth metacarpal neck. There is no evidence of arthropathy or other focal bone abnormality. Air within the soft tissues of the volar aspect of the distal forearm. There is also scattered foci of soft tissue air at the ulnar aspect of the hand near the fifth metacarpal diaphysis. No radiopaque foreign body is seen within the soft tissues. IMPRESSION: 1. No acute fracture, dislocation, or radiopaque foreign body of the left hand. 2. Air within the soft tissues of the volar aspect of the distal forearm and hand compatible with penetrating trauma/animal bite. Electronically Signed   By: Davina Poke D.O.   On: 12/04/2020 15:21   DG Hand Complete Right  Result Date: 12/04/2020 CLINICAL DATA:  Dog bite EXAM: RIGHT HAND - COMPLETE 3+ VIEW COMPARISON:  08/03/2015, 07/28/2015 FINDINGS:  There are radiodensities adjacent to the ulnar aspect of the right small finger. Additional tiny radiodensities along the volar aspect of what appears to be the long finger at the level of the middle phalanx. Findings are poorly evaluated  secondary to patient positioning as patient is slightly flexed on all views. Mild soft tissue swelling. No dislocation. IMPRESSION: Limited exam. Radiopaque foreign bodies adjacent to the right small finger and likely the long finger at the level of the middle phalanx. Findings may reflect retained foreign bodies versus fracture fragments. Recommend repeat radiographs of the right hand without fingers flexed. Electronically Signed   By: Davina Poke D.O.   On: 12/04/2020 15:26    Procedures .Marland KitchenLaceration Repair  Date/Time: 12/05/2020 3:40 PM Performed by: Claud Kelp, MD Authorized by: Davonna Belling, MD   Consent:    Consent obtained:  Verbal   Consent given by:  Patient   Risks, benefits, and alternatives were discussed: yes     Risks discussed:  Infection, pain, poor cosmetic result and poor wound healing   Alternatives discussed:  No treatment Universal protocol:    Patient identity confirmed:  Verbally with patient Anesthesia:    Anesthesia method:  Local infiltration   Local anesthetic:  Lidocaine 2% w/o epi and lidocaine 1% w/o epi Laceration details:    Location: Four lacerations to left forearm around 3 cm each, one laceration to the right first digit that is 4 cm. Pre-procedure details:    Preparation:  Patient was prepped and draped in usual sterile fashion and imaging obtained to evaluate for foreign bodies Exploration:    Hemostasis achieved with:  Direct pressure   Imaging obtained: x-ray     Imaging outcome: foreign body not noted     Wound exploration: wound explored through full range of motion and entire depth of wound visualized     Wound extent: no nerve damage noted, no tendon damage noted, no underlying fracture noted and no  vascular damage noted   Treatment:    Area cleansed with:  Saline   Amount of cleaning:  Extensive   Irrigation solution:  Sterile saline   Irrigation volume:  1L   Irrigation method:  Syringe   Visualized foreign bodies/material removed: no     Debridement:  None   Undermining:  None Skin repair:    Repair method:  Sutures   Suture size:  5-0   Wound skin closure material used: Vicryl. Patient requested absorbable sutures only.   Suture technique:  Simple interrupted   Number of sutures: 7 in left forearm. 6 in R first digit. Approximation:    Approximation:  Loose Repair type:    Repair type:  Intermediate Post-procedure details:    Procedure completion:  Tolerated well, no immediate complications   Medications Ordered in ED Medications  Tdap (BOOSTRIX) injection 0.5 mL (0.5 mLs Intramuscular Given 12/04/20 1453)  oxyCODONE (Oxy IR/ROXICODONE) immediate release tablet 5 mg (5 mg Oral Given 12/04/20 1451)  ibuprofen (ADVIL) tablet 400 mg (400 mg Oral Given 12/04/20 1451)  acetaminophen (TYLENOL) tablet 1,000 mg (1,000 mg Oral Given 12/04/20 1451)  fentaNYL (SUBLIMAZE) injection 50 mcg (50 mcg Intravenous Given 12/04/20 1601)  lidocaine (PF) (XYLOCAINE) 1 % injection 10 mL (10 mLs Infiltration Given 12/04/20 1643)    ED Course  I have reviewed the triage vital signs and the nursing notes.  Pertinent labs & imaging results that were available during my care of the patient were reviewed by me and considered in my medical decision making (see chart for details).    MDM Rules/Calculators/A&P                           20 yo M with DM1  p/w following dog bite. Patient sustained multiple lacerations to the left forearm and right hand. I asked nursing to provide dog bit paperwork to the patient. Dog is patient's neighbor's and can be monitored, it is UTD on shots per patient. Will update his tetanus. Plain films obtained with no underlying fracture. Physical exam notable for a laceration to the  flexor surface of the patient's right index finger but no tendon involve, fully intact flexion on thorough hand exam. Repaired loosely as above in order to improve function and cosmetic outcome without increasing infection risk. Wounds were irrigated thoroughly prior to repair and much dog hair had to be removed from wounds. Will prescribe Augmentin. I discussed with the patient the importance of antibiotics and close outpatient follow up, as his diabetes puts him at high risk for infection and he could lose functionality in his hands if his wounds become infected. Will provide phone number to call for Hand surgery follow up. Patient expresses understanding and agreement.   Final Clinical Impression(s) / ED Diagnoses Final diagnoses:  Dog bite, initial encounter    Rx / DC Orders ED Discharge Orders          Ordered    amoxicillin-clavulanate (AUGMENTIN) 875-125 MG tablet  Every 12 hours        12/04/20 1722             Claud Kelp, MD 12/05/20 1548    Davonna Belling, MD 12/06/20 [phone]

## 2020-12-27 ENCOUNTER — Other Ambulatory Visit (INDEPENDENT_AMBULATORY_CARE_PROVIDER_SITE_OTHER): Payer: Self-pay | Admitting: "Endocrinology

## 2020-12-27 DIAGNOSIS — E1065 Type 1 diabetes mellitus with hyperglycemia: Secondary | ICD-10-CM

## 2021-01-07 ENCOUNTER — Ambulatory Visit (HOSPITAL_COMMUNITY)
Admission: EM | Admit: 2021-01-07 | Discharge: 2021-01-07 | Disposition: A | Discharge status: Home / Self Care | Payer: Medicaid Other | Attending: Emergency Medicine | Admitting: Emergency Medicine

## 2021-01-07 ENCOUNTER — Other Ambulatory Visit: Payer: Self-pay

## 2021-01-07 ENCOUNTER — Encounter (HOSPITAL_COMMUNITY): Payer: Self-pay | Admitting: Emergency Medicine

## 2021-01-07 ENCOUNTER — Ambulatory Visit (INDEPENDENT_AMBULATORY_CARE_PROVIDER_SITE_OTHER): Payer: Medicaid Other

## 2021-01-07 DIAGNOSIS — S92355A Nondisplaced fracture of fifth metatarsal bone, left foot, initial encounter for closed fracture: Secondary | ICD-10-CM | POA: Diagnosis not present

## 2021-01-07 DIAGNOSIS — Z5189 Encounter for other specified aftercare: Secondary | ICD-10-CM

## 2021-01-07 DIAGNOSIS — M79671 Pain in right foot: Secondary | ICD-10-CM

## 2021-01-07 DIAGNOSIS — S61411D Laceration without foreign body of right hand, subsequent encounter: Secondary | ICD-10-CM

## 2021-01-07 NOTE — ED Provider Notes (Signed)
[facility]    CSN: [id number] Arrival date & time: 01/07/21  [id number]      History   Chief Complaint Chief Complaint  Patient presents with   Foot Pain   Wound Check    HPI Rodney [name] is a 20 y.o. male.   Patient here for evaluation of right foot and toe pain that has been ongoing for the past several days.  Patient reports that he twisted it and "felt a pop."  Reports pain primarily to right lateral foot with decreased range of motion and bruising to third fourth and fifth toes.  Also requesting recheck on hand wound.  Patient was bitten by a dog approximately 1 month ago and is concerned about a possible infection.  Did not follow-up with hand as recommended.  Reports sutures did not dissolve.  Has not been washing hand regularly.  Denies any trauma, injury, or other precipitating event.  Denies any specific alleviating or aggravating factors.  Denies any fevers, chest pain, shortness of breath, N/V/D, numbness, tingling, weakness, abdominal pain, or headaches.    The history is provided by the patient.  Foot Pain  Wound Check   Past Medical History:  Diagnosis Date   Abrasion of arm, right 08/22/2011   Bipolar 1 disorder ([place])    Depression    Diabetes mellitus    Type I - insulin pump   Nasal fracture 08/13/2011   was hit in nose while playing basketball   Seizures ([place])    x 3 - due to low blood sugar - last time 5-6 yrs. 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   GERD (gastroesophageal reflux disease) 09/15/2015   Unintentional weight loss 09/15/2015   Injury of right hand 07/28/2015   Diabetic ketoacidosis without coma associated with type 1 diabetes mellitus ([place])    Ketonuria 04/17/2014   Poorly controlled type 1 diabetes mellitus ([place]) 04/17/2014   Hyperglycemia due to type 1 diabetes mellitus ([place])  03/18/2014   Diabetic ketoacidosis ([place]) 05/20/2013   Dehydration 05/06/2013   Non compliance with medical treatment 05/06/2013   DKA (diabetic ketoacidoses) ([place]) 05/05/2013   Diabetes ([place]) 03/25/2013   Mild Diabetic ketoacidosis 03/25/2013   Nausea with vomiting 03/25/2013   Adjustment reaction 12/05/2012   DKA, type 1 ([place]) 12/04/2012   Non compliance w medication regimen 11/21/2012   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[place]) 08/02/2012   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   Fracture, nasal 08/24/2011    Class: Acute   Goiter    Hypoglycemia associated with diabetes ([place]) 05/23/2011   Thyroiditis, 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/23/2011   Diabetic autonomic neuropathy ([place]) 05/23/2011   Tachycardia 05/23/2011   Type I (juvenile type) diabetes mellitus without mention of complication, uncontrolled 08/30/2010   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 Authorizing Provider  Accu-Chek FastClix Lancets MISC CHECK SUGAR 10 X DAILY 06/25/15   [provider]  acetaminophen (TYLENOL)  500 MG tablet Take 1,000-1,500 mg by mouth every 6 (six) hours as needed for headache or mild pain.    [provider]  amoxicillin-clavulanate (AUGMENTIN) 875-125 MG tablet Take 1 tablet by mouth every 12 (twelve) hours. 12/04/20   Claud Kelp, MD  glucagon (GLUCAGON EMERGENCY) 1 MG injection Inject 1 mg into the vein once as needed (for hypoglycemia). 02/20/18   Sherrlyn Hock, MD  glucose blood (ACCU-CHEK GUIDE) test strip USE 6X DAILY TO CHECK GLUCOSE 07/24/20   Sherrlyn Hock, MD  ibuprofen (ADVIL) 200 MG tablet Take 400-600 mg by mouth every 6 (six) hours as needed for headache or mild pain.    [provider]  insulin aspart (NOVOLOG FLEXPEN) 100 UNIT/ML FlexPen USE UP TO 50 UNITS DAILY. Patient taking differently: Inject 1-15 Units into the skin 3 (three) times daily with meals. Per sliding scale 07/24/20   Sherrlyn Hock, MD  insulin glargine (LANTUS SOLOSTAR) 100 UNIT/ML Solostar Pen Use up to 50 units daily Patient taking differently: Inject 32 Units into the skin at bedtime. 09/08/20   Sherrlyn Hock, MD  Insulin Pen Needle (BD PEN NEEDLE NANO U/F) 32G X 4 MM MISC USE TO INJECT INSULIN VIA INSULIN PEN 7 TIME A DAY Patient taking differently: 1 each by Other route See admin instructions. USE TO INJECT INSULIN VIA INSULIN PEN 7 TIME A DAY 05/28/18   Sherrlyn Hock, MD  Insulin Syringe-Needle U-100 31G X 5/16" 1 ML MISC Use with insulin vial 08/07/20   Sherrlyn Hock, MD    Family History Family History  Problem Relation Age of Onset   Heart disease Mother        MI at age 52   Diabetes Paternal Grandfather        type 2   Hypertension Paternal Grandfather    Heart disease Paternal Grandfather    Hepatitis C Brother        older brother (incarcerated)   Hypertension Father     Social History Social History   Tobacco Use   Smoking status: Every Day    Packs/day: 1.00    Years: 6.00    Pack years: 6.00    Types: Cigarettes   Smokeless tobacco: Never  Vaping Use   Vaping Use: Never used  Substance Use Topics   Alcohol use: Not Currently   Drug use: Yes    Types: Marijuana     Allergies   Patient has no known allergies.   Review of Systems Review of Systems  Musculoskeletal:  Positive for arthralgias and joint swelling.  Skin:  Positive for wound.  All other systems reviewed and are negative.   Physical Exam Triage Vital Signs ED Triage Vitals  Enc Vitals Group     BP 01/07/21 0850 132/82     Pulse  Rate 01/07/21 0850 (!) 113     Resp 01/07/21 0850 16     Temp 01/07/21 0850 98.6 F (37 C)     Temp Source 01/07/21 0850 Oral     SpO2 01/07/21 0850 98 %     Weight --      Height --      Head Circumference --      Peak Flow --      Pain Score 01/07/21 0849 6     Pain Loc --      Pain Edu? --      Excl. in [place]? --    No data found.  Updated Vital  Signs BP 132/82 (BP Location: Left Arm)   Pulse (!) 113   Temp 98.6 F (37 C) (Oral)   Resp 16   SpO2 98%   Visual Acuity Right Eye Distance:   Left Eye Distance:   Bilateral Distance:    Right Eye Near:   Left Eye Near:    Bilateral Near:     Physical Exam Vitals and nursing note reviewed.  Constitutional:      General: He is not in acute distress.    Appearance: Normal appearance. He is not ill-appearing, toxic-appearing or diaphoretic.  HENT:     Head: Normocephalic and atraumatic.  Eyes:     Conjunctiva/sclera: Conjunctivae normal.  Cardiovascular:     Rate and Rhythm: Normal rate.     Pulses: Normal pulses.  Pulmonary:     Effort: Pulmonary effort is normal.  Abdominal:     General: Abdomen is flat.  Musculoskeletal:     Cervical back: Normal range of motion.     Right foot: Decreased range of motion. Normal capillary refill. Tenderness present. Normal pulse.     Left foot: Normal.  Skin:    General: Skin is warm and dry.     Findings: Wound (right hand, scabbing noted) present.  Neurological:     General: No focal deficit present.     Mental Status: He is alert and oriented to person, place, and time.  Psychiatric:        Mood and Affect: Mood normal.        UC Treatments / Results  Labs (all labs ordered are listed, but only abnormal results are displayed) Labs Reviewed - No data to display  EKG   Radiology DG Foot Complete Right  Result Date: 01/07/2021 CLINICAL DATA:  Foot pain EXAM: RIGHT FOOT COMPLETE - 3+ VIEW COMPARISON:  None. FINDINGS: Fracture through the fifth metatarsal styloid  with two transverse fracture lines, the more proximal fracture line involves the articular surface. No evidence of arthropathy. Soft tissue swelling of the lateral foot. IMPRESSION: Avulsion fracture of the proximal fifth metatarsal. Electronically Signed   By: Yetta Glassman M.D.   On: 01/07/2021 09:54    Procedures Procedures (including critical care time)  Medications Ordered in UC Medications - No data to display  Initial Impression / Assessment and Plan / UC Course  I have reviewed the triage vital signs and the nursing notes.  Pertinent labs & imaging results that were available during my care of the patient were reviewed by me and considered in my medical decision making (see chart for details).    Assessment negative for red flags or concerns. Fifth metatarsal fracture.  X-ray shows avulsion fracture of the proximal fifth metatarsal.  Postop shoe applied in office. Or ibuprofen as needed.  Recommend rest, ice, compression, and elevation.  Follow-up with orthopedics as soon as possible for reevaluation. Wound recheck.  Right hand soaked and cleaned with warm soapy water.  No erythema, edema, or discharge.  Discussed that patient needs to wash his hand at least once a day.  Recommend following up with orthopedics as previously recommended.  Final Clinical Impressions(s) / UC Diagnoses   Final diagnoses:  Nondisplaced fracture of fifth metatarsal bone, left foot, initial encounter for closed fracture  Visit for wound check     Discharge Instructions      Wear the post op shoe when standing or walking.   You can take Tylenol and/or Ibuprofen as needed for pain relief and fever reduction.  Rest as much as possible Ice for 10-15 minutes every 4-6 hours as needed for pain and swelling Compression- use an ace bandage or splint for comfort Elevate above your hip/heart when sitting and laying down  Wash your hand with soapy and water at least once a day.   Follow up with  sports medicine or orthopedics for re-evaluation.      ED Prescriptions   None    PDMP not reviewed this encounter.   Pearson Forster, NP 01/07/21 1022

## 2021-01-07 NOTE — ED Triage Notes (Signed)
Pt reports needs wound check on right hand since dog bite on 8/5. Pt reports swelling, pain, and foul odor. Stiches still noted in one area of would on hand that never dissolved/fall out on there on. Pt also having right pain for 4 days after injuring it. Bruising to 3rd, 4th, 5th toes noted.

## 2021-01-07 NOTE — Discharge Instructions (Signed)
Wear the post op shoe when standing or walking.   You can take Tylenol and/or Ibuprofen as needed for pain relief and fever reduction.   Rest as much as possible Ice for 10-15 minutes every 4-6 hours as needed for pain and swelling Compression- use an ace bandage or splint for comfort Elevate above your hip/heart when sitting and laying down  Wash your hand with soapy and water at least once a day.   Follow up with sports medicine or orthopedics for re-evaluation.

## 2021-01-29 DIAGNOSIS — F17200 Nicotine dependence, unspecified, uncomplicated: Secondary | ICD-10-CM | POA: Insufficient documentation

## 2021-01-29 DIAGNOSIS — S92353A Displaced fracture of fifth metatarsal bone, unspecified foot, initial encounter for closed fracture: Secondary | ICD-10-CM | POA: Insufficient documentation

## 2021-07-12 ENCOUNTER — Emergency Department (HOSPITAL_COMMUNITY)
Admission: EM | Admit: 2021-07-12 | Discharge: 2021-07-12 | Disposition: A | Discharge status: Home / Self Care | Payer: Medicaid Other | Attending: Emergency Medicine | Admitting: Emergency Medicine

## 2021-07-12 ENCOUNTER — Encounter (HOSPITAL_COMMUNITY): Payer: Self-pay

## 2021-07-12 ENCOUNTER — Other Ambulatory Visit: Payer: Self-pay

## 2021-07-12 DIAGNOSIS — E1065 Type 1 diabetes mellitus with hyperglycemia: Secondary | ICD-10-CM | POA: Diagnosis present

## 2021-07-12 DIAGNOSIS — R739 Hyperglycemia, unspecified: Secondary | ICD-10-CM

## 2021-07-12 LAB — BETA-HYDROXYBUTYRIC ACID: Beta-Hydroxybutyric Acid: 2.1 mmol/L — ABNORMAL HIGH (ref 0.05–0.27)

## 2021-07-12 LAB — CBC WITH DIFFERENTIAL/PLATELET
Abs Immature Granulocytes: 0.02 10*3/uL (ref 0.00–0.07)
Basophils Absolute: 0 10*3/uL (ref 0.0–0.1)
Basophils Relative: 0 %
Eosinophils Absolute: 0 10*3/uL (ref 0.0–0.5)
Eosinophils Relative: 0 %
HCT: 46.3 % (ref 39.0–52.0)
Hemoglobin: 15.7 g/dL (ref 13.0–17.0)
Immature Granulocytes: 0 %
Lymphocytes Relative: 27 %
Lymphs Abs: 2 10*3/uL (ref 0.7–4.0)
MCH: 28.6 pg (ref 26.0–34.0)
MCHC: 33.9 g/dL (ref 30.0–36.0)
MCV: 84.3 fL (ref 80.0–100.0)
Monocytes Absolute: 0.5 10*3/uL (ref 0.1–1.0)
Monocytes Relative: 6 %
Neutro Abs: 5 10*3/uL (ref 1.7–7.7)
Neutrophils Relative %: 67 %
Platelets: 330 10*3/uL (ref 150–400)
RBC: 5.49 MIL/uL (ref 4.22–5.81)
RDW: 12.8 % (ref 11.5–15.5)
WBC: 7.5 10*3/uL (ref 4.0–10.5)
nRBC: 0 % (ref 0.0–0.2)

## 2021-07-12 LAB — COMPREHENSIVE METABOLIC PANEL
ALT: 27 U/L (ref 0–44)
AST: 19 U/L (ref 15–41)
Albumin: 4.6 g/dL (ref 3.5–5.0)
Alkaline Phosphatase: 98 U/L (ref 38–126)
Anion gap: 11 (ref 5–15)
BUN: 19 mg/dL (ref 6–20)
CO2: 26 mmol/L (ref 22–32)
Calcium: 9.7 mg/dL (ref 8.9–10.3)
Chloride: 96 mmol/L — ABNORMAL LOW (ref 98–111)
Creatinine, Ser: 0.92 mg/dL (ref 0.61–1.24)
GFR, Estimated: >60 mL/min (ref >60)
Glucose, Bld: 522 mg/dL (ref 70–99)
Potassium: 4 mmol/L (ref 3.5–5.1)
Sodium: 133 mmol/L — ABNORMAL LOW (ref 135–145)
Total Bilirubin: 0.6 mg/dL (ref 0.3–1.2)
Total Protein: 8.2 g/dL — ABNORMAL HIGH (ref 6.5–8.1)

## 2021-07-12 LAB — URINALYSIS, ROUTINE W REFLEX MICROSCOPIC
Bacteria, UA: NONE SEEN
Bilirubin Urine: NEGATIVE
Glucose, UA: >=500 mg/dL — AB
Hgb urine dipstick: NEGATIVE
Ketones, ur: 20 mg/dL — AB
Leukocytes,Ua: NEGATIVE
Nitrite: NEGATIVE
Protein, ur: NEGATIVE mg/dL
Specific Gravity, Urine: 1.027 (ref 1.005–1.030)
pH: 6 (ref 5.0–8.0)

## 2021-07-12 LAB — LIPASE, BLOOD: Lipase: 26 U/L (ref 11–51)

## 2021-07-12 LAB — CBG MONITORING, ED: Glucose-Capillary: 525 mg/dL (ref 70–99)

## 2021-07-12 LAB — OSMOLALITY: Osmolality: 306 mOsm/kg — ABNORMAL HIGH (ref 275–295)

## 2021-07-12 MED ORDER — LACTATED RINGERS IV BOLUS
1000.0000 mL | Freq: Once | INTRAVENOUS | Status: AC
  Administered 2021-07-12: 1000 mL via INTRAVENOUS

## 2021-07-12 MED ORDER — ONDANSETRON HCL 4 MG/2ML IJ SOLN: 4.0000 mg | Freq: Once | INTRAMUSCULAR | Status: DC

## 2021-07-12 NOTE — ED Provider Notes (Incomplete)
[facility] DEPT Provider Note   CSN: [id number] Arrival date & time: 07/12/21  1738     History {Add pertinent medical, surgical, social history, OB history to HPI:1} Chief Complaint  Patient presents with   Hyperglycemia    Rodney [name] is a 21 y.o. male.   Hyperglycemia     Home Medications Prior to Admission medications   Medication Sig Start Date End Date Taking? Authorizing Provider  Accu-Chek FastClix Lancets MISC CHECK SUGAR 10 X DAILY 06/25/15   [provider]  acetaminophen (TYLENOL) 500 MG tablet Take 1,000-1,500 mg by mouth every 6 (six) hours as needed for headache or mild pain.    [provider]  amoxicillin-clavulanate (AUGMENTIN) 875-125 MG tablet Take 1 tablet by mouth every 12 (twelve) hours. 12/04/20   Claud Kelp, MD  glucagon (GLUCAGON EMERGENCY) 1 MG injection Inject 1 mg into the vein once as needed (for hypoglycemia). 02/20/18   Sherrlyn Hock, MD  glucose blood (ACCU-CHEK GUIDE) test strip USE 6X DAILY TO CHECK GLUCOSE 07/24/20   Sherrlyn Hock, MD  ibuprofen (ADVIL) 200 MG tablet Take 400-600 mg by mouth every 6 (six) hours as needed for headache or mild pain.    [provider]  insulin aspart (NOVOLOG FLEXPEN) 100 UNIT/ML FlexPen USE UP TO 50 UNITS DAILY. Patient taking differently: Inject 1-15 Units into the skin 3 (three) times daily with meals. Per sliding scale 07/24/20   Sherrlyn Hock, MD  insulin glargine (LANTUS SOLOSTAR) 100 UNIT/ML Solostar Pen Use up to 50 units daily Patient taking differently: Inject 32 Units into the skin at bedtime. 09/08/20   Sherrlyn Hock, MD  Insulin Pen Needle (BD PEN NEEDLE NANO U/F) 32G X 4 MM MISC USE TO INJECT INSULIN VIA INSULIN PEN 7 TIME A DAY Patient taking differently: 1 each by Other route See admin instructions. USE TO INJECT INSULIN VIA INSULIN PEN 7 TIME A DAY 05/28/18   Sherrlyn Hock, MD  Insulin Syringe-Needle U-100 31G X  5/16" 1 ML MISC Use with insulin vial 08/07/20   Sherrlyn Hock, MD      Allergies    Patient has no known allergies.    Review of Systems   Review of Systems  Physical Exam Updated Vital Signs BP 132/89    Pulse 73    Temp 98.8 F (37.1 C) (Oral)    Resp 13    Ht '5\' 8"'$  (1.727 m)    Wt 54.4 kg    SpO2 100%    BMI 18.25 kg/m  Physical Exam  ED Results / Procedures / Treatments   Labs (all labs ordered are listed, but only abnormal results are displayed) Labs Reviewed  URINALYSIS, ROUTINE W REFLEX MICROSCOPIC - Abnormal; Notable for the following components:      Result Value   Color, Urine COLORLESS (*)    Glucose, UA >=500 (*)    Ketones, ur 20 (*)    All other components within normal limits  COMPREHENSIVE METABOLIC PANEL - Abnormal; Notable for the following components:   Sodium 133 (*)    Chloride 96 (*)    Glucose, Bld 522 (*)    Total Protein 8.2 (*)    All other components within normal limits  CBG MONITORING, ED - Abnormal; Notable for the following components:   Glucose-Capillary 525 (*)    All other components within normal limits  CBC WITH DIFFERENTIAL/PLATELET  LIPASE, BLOOD  BETA-HYDROXYBUTYRIC ACID  OSMOLALITY  BLOOD GAS,  VENOUS    EKG None  Radiology No results found.  Procedures Procedures  {Document cardiac monitor, telemetry assessment procedure when appropriate:1}  Medications Ordered in ED Medications  lactated ringers bolus 1,000 mL (1,000 mLs Intravenous New Bag/Given 07/12/21 1940)    ED Course/ Medical Decision Making/ A&P                           Medical Decision Making Amount and/or Complexity of Data Reviewed Labs: ordered.   ***  {Document critical care time when appropriate:1} {Document review of labs and clinical decision tools ie heart score, Chads2Vasc2 etc:1}  {Document your independent review of radiology images, and any outside records:1} {Document your discussion with family members, caretakers, and with  consultants:1} {Document social determinants of health affecting pt's care:1} {Document your decision making why or why not admission, treatments were needed:1} Final Clinical Impression(s) / ED Diagnoses Final diagnoses:  None    Rx / DC Orders ED Discharge Orders     None

## 2021-07-12 NOTE — ED Provider Triage Note (Cosign Needed)
Emergency Medicine Provider Triage Evaluation Note ? ?Rodney [name] , a 21 y.o. male  was evaluated in triage.  Pt complains of hyperglycemia x 1 few days. Patient does not have a glucometer at home, he has been using his insulin "depending on how I feel". Last taken lantus and novalog last night. He does endorse polydipsia and polyuria.  ? ?Review of Systems  ?Positive: Urinary symptoms, abdominal pain ?Negative: Fever, cough ? ?Physical Exam  ?BP 132/87 (BP Location: Left Arm)   Pulse 86   Temp 98.8 ?F (37.1 ?C) (Oral)   Resp 16   SpO2 94%  ?Gen:   Awake, no distress   ?Resp:  Normal effort  ?MSK:   Moves extremities without difficulty  ?Other:   ? ?Medical Decision Making  ?Medically screening exam initiated at 5:58 PM.  Appropriate orders placed.  Rodney [name] was informed that the remainder of the evaluation will be completed by another provider, this initial triage assessment does not replace that evaluation, and the importance of remaining in the ED until their evaluation is complete. ? ?T1DM here with CBG 500, non compliance with medications, symptomatic.  ?  Janeece Fitting, PA-C ?07/12/21 1800 ? ?

## 2021-07-12 NOTE — ED Triage Notes (Signed)
Patient states he ran out of Novolog insulin and states he bought an off brand insulin that he did not have a prescription for. ?Patient states he has not checked his sugar for months because he ran out of the strips. ?Patient's father reports that the patient missed a couple of appointments with the endocrinologist and does not have a physician to prescription insulin. ? ?Patient has vomited x 3,abdominal pain and dry mouth. ?CBG-525 in triage. ? ?

## 2021-07-12 NOTE — ED Notes (Signed)
Pt states that he wants to leave AMA, reports that he understands risk of leaving. Pt reports that he just wants to leave, does not want to receive medications prior to leaving.  ?

## 2021-08-10 ENCOUNTER — Ambulatory Visit (INDEPENDENT_AMBULATORY_CARE_PROVIDER_SITE_OTHER): Payer: Medicaid Other | Admitting: Family Medicine

## 2021-08-10 ENCOUNTER — Encounter (HOSPITAL_BASED_OUTPATIENT_CLINIC_OR_DEPARTMENT_OTHER): Payer: Self-pay | Admitting: Family Medicine

## 2021-08-10 VITALS — BP 123/63 | HR 101 | Temp 97.7°F | Ht 68.0 in | Wt 130.6 lb

## 2021-08-10 DIAGNOSIS — F319 Bipolar disorder, unspecified: Secondary | ICD-10-CM

## 2021-08-10 DIAGNOSIS — S61451A Open bite of right hand, initial encounter: Secondary | ICD-10-CM | POA: Diagnosis not present

## 2021-08-10 DIAGNOSIS — S61459A Open bite of unspecified hand, initial encounter: Secondary | ICD-10-CM | POA: Insufficient documentation

## 2021-08-10 DIAGNOSIS — W540XXA Bitten by dog, initial encounter: Secondary | ICD-10-CM

## 2021-08-10 DIAGNOSIS — E1065 Type 1 diabetes mellitus with hyperglycemia: Secondary | ICD-10-CM | POA: Diagnosis not present

## 2021-08-10 MED ORDER — BLOOD GLUCOSE METER KIT
PACK | 0 refills | Status: DC
Start: 2021-08-10 — End: 2021-10-13

## 2021-08-10 MED ORDER — NOVOLOG FLEXPEN 100 UNIT/ML ~~LOC~~ SOPN: PEN_INJECTOR | SUBCUTANEOUS | 0 refills | Status: DC

## 2021-08-10 MED ORDER — LANTUS SOLOSTAR 100 UNIT/ML ~~LOC~~ SOPN
PEN_INJECTOR | SUBCUTANEOUS | 0 refills | Status: DC
Start: 2021-08-10 — End: 2021-09-07

## 2021-08-10 NOTE — Patient Instructions (Signed)
Type 1 Diabetes Mellitus, Diagnosis, Adult Type 1 diabetes (type 1 diabetes mellitus) is a long-term disease. It happens when the pancreas does not make enough of a hormone called insulin. This hormone lets sugars (glucose) get into cells in the body. The sugars give the body energy. If the body does not make enough insulin, sugars cannot get into cells. This causes high blood sugar (hyperglycemia). There is no cure for this disease right now, but you can treat it with insulin and lifestyle changes. What are the causes? The exact cause of type 1 diabetes is not known. What increases the risk? You may be more likely to develop this condition if: Someone in your family has type 1 diabetes. Your body's disease-fighting system (immune system) attacks itself. You are around some kinds of germs. There is cold weather where you live. What are the signs or symptoms? You may get symptoms slowly over days or weeks, or you may get them all of a sudden. Symptoms may include: More thirst or hunger than normal. Peeing more often than normal. Peeing more often at night. Sudden weight change. Weight change that you cannot explain. Feeling tired or weak. Seeing things blurry. How is this treated? This condition may be treated by a diabetes expert. You can help care for your condition by following instructions from your doctor. You may need to: Take insulin daily. Take medicines to help you avoid health problems that may be caused by your condition. Check your blood sugar as often as told. Change your diet. You may need an eating plan made just for you by a food expert (dietitian). Get regular exercise. Find ways to lower stress. Your doctor will set treatment goals for you. These will be based on your age, other health problems you have, and how much your diabetes treatment helps you. You should try to keep your blood sugar at these levels: Before meals: 80-130 mg/dL (4.4-7.2 mmol/L). After meals: below  180 mg/dL (10 mmol/L). A1C (hemoglobin A1C)level: less than 7%. Follow these instructions at home: Questions to ask your doctor Do I need to meet with a diabetes educator? Should I join a support group for people with diabetes? What equipment will I need to care for myself at home? What medicines do I need? When should I take them? How often do I need to check my blood sugar? What number can I call if I have questions? When is my next doctor visit? General instructions Take over-the-counter and prescription medicines only as told by your doctor. Keep all follow-up visits as told by your doctor. This is important. Where to find more information American Diabetes Association (ADA): diabetes.org Association of Diabetes Care and Education Specialists (ADCES): diabeteseducator.org International Diabetes Federation (IDF): idf.org Contact a doctor if: Your blood sugar is higher than 240 mg/dL (13.3 mmol/L) for 2 days in a row. You have been sick for 2 days or more, and you are not getting better. You have had a fever for 2 days or more, and you are not getting better. You have any of these problems for more than 6 hours: You cannot eat or drink. You feel like you may vomit. You vomit. You have watery poop. Get help right away if: Your blood sugar is below 54 mg/dL (3 mmol/L). You get mixed up (confused). You cannot think clearly. You have trouble breathing. These symptoms may be an emergency. Do not wait to see if the symptoms will go away. Get medical help right away. Call your local emergency   services (911 in the [place] Do not drive yourself to the hospital. Summary Type 1 diabetes is a long-term disease. It happens when your pancreas does not make enough of a hormone called insulin. The exact cause of this condition is not known. There is no cure for it right now. This condition may be treatedwith insulin and other medicines. You may also treat it with diet or lifestyle changes. Your  doctor will set treatment goals for you. These will be based on your age, other health problems, and how much your treatment helps you. This information is not intended to replace advice given to you by your health care provider. Make sure you discuss any questions you have with your health care provider. Document Revised: 06/06/2019 Document Reviewed: 06/06/2019 Elsevier Patient Education  2022 [employer]  

## 2021-08-10 NOTE — Progress Notes (Signed)
? ?New Patient Office Visit ? ?Subjective:  ?Patient ID: Rodney [name], male    DOB: 2000/11/25  Age: 21 y.o. MRN: [id number] ? ?CC: Establish care, DM1, BPD, dog bite ? ?HPI ?Rodney [name] is a 21 year old male presenting to establish in clinic.  He has current concerns as outlined above.  He has past medical history of type 1 diabetes, bipolar disorder, depression, tobacco use.  He also had recent injury with dog bite to right hand ? ?Type 1 diabetes: Patient reports that he was diagnosed years 21 years old.  Up until about 2 years ago, he was following with endocrinology.  He was being managed on Lantus 32 units daily and mealtime NovoLog according to sliding scale as indicated by endocrinology.  Patient has had issues with keeping appointments and treatment compliance.  On history, it seems patient was discharged from his endocrinologist office.  Patient has had several emergency department visits over the past couple years, some of which have been related to hypoglycemia.  He indicates that he intermittently has been using insulin at home.  Reports that his girlfriend will find insulin for him through [employer].  Use of any insulin has been variable.  He was in the emergency department about 1 month ago for elevated blood sugar, however left AMA during treatment. ? ?Dog bite to right hand: Patient was seen in the emergency department for this.  He was advised to follow-up with orthopedic surgeon for monitoring and treatment, he did not schedule appointment with orthopedic surgeon for this.  He did end up seeing orthopedic surgeon related to a foot fracture that was diagnosed about 1 month later.  Related to the foot injury, patient did have a cast placed, however he did not follow-up with orthopedic surgeon as he cut the cast off on his own.  Today is wondering if he needs to be seen by orthopedic surgeon due to some continued soreness and restricted range of motion in his right hand and in particular his little  finger. ? ?Patient does currently smoke.  He also has a history of bipolar disorder, not currently seeing psychiatrist or counselor relating to this. ? ?Past Medical History:  ?Diagnosis Date  ? Abrasion of arm, right 08/22/2011  ? Bipolar 1 disorder ([place])   ? Depression   ? Diabetes mellitus   ? Type I - insulin pump  ? Nasal fracture 08/13/2011  ? was hit in nose while playing basketball  ? Seizures ([place])   ? x 3 - due to low blood sugar - last time 5-6 yrs. ago  ? ? ?Past Surgical History:  ?Procedure Laterality Date  ? APPENDECTOMY    ? CLOSED REDUCTION NASAL FRACTURE  08/24/2011  ? Procedure: CLOSED REDUCTION NASAL FRACTURE;  Surgeon: [name] Belfast, MD;  Location: [facility];  Service: ENT;  Laterality: N/A;  ? ? ?Family History  ?Problem Relation Age of Onset  ? Heart disease Mother   ?     MI at age 73  ? Diabetes Paternal Grandfather   ?     type 2  ? Hypertension Paternal Grandfather   ? Heart disease Paternal Grandfather   ? Hepatitis C Brother   ?     older brother (incarcerated)  ? Hypertension Father   ? ? ?Social History  ? ?Socioeconomic History  ? Marital status: Single  ?  Spouse name: Not on file  ? Number of children: Not on file  ? Years of education: Not on file  ?  Highest education level: Not on file  ?Occupational History  ? Occupation: window Proofreader  ?Tobacco Use  ? Smoking status: Every Day  ?  Packs/day: 1.50  ?  Years: 6.00  ?  Pack years: 9.00  ?  Types: Cigarettes  ?  Start date: 07-16-2016  ? Smokeless tobacco: Never  ?Vaping Use  ? Vaping Use: Never used  ?Substance and Sexual Activity  ? Alcohol use: Not Currently  ? Drug use: Not Currently  ?  Types: Marijuana  ? Sexual activity: Yes  ?  Partners: Female  ?  Birth control/protection: None  ?Other Topics Concern  ? Not on file  ?Social History Narrative  ? Lives with dad and twin brother. Mom deceased 07-16-05 from [name]. 8th grade at [employer]. [place] football. Pt has not smoked cigarettes or marijuana in the last 9 months.    ? ?Social Determinants of Health  ? ?Financial Resource Strain: Not on file  ?Food Insecurity: Not on file  ?Transportation Needs: Not on file  ?Physical Activity: Not on file  ?Stress: Not on file  ?Social Connections: Not on file  ?Intimate Partner Violence: Not on file  ? ? ?Objective:  ? ?Today's Vitals: BP 123/63   Pulse (!) 101   Temp 97.7 ?F (36.5 ?C)   Ht 5' 8"  (1.727 m)   Wt 130 lb 9.6 oz (59.2 kg)   SpO2 100%   BMI 19.86 kg/m?  ? ?Physical Exam ? ?21 year old male in no acute distress ?Cardiovascular Sam with regular rate and rhythm ?Lungs clear to auscultation bilaterally ?Patient does have scar over right middle finger with restriction in extension as compared to unaffected left finger ? ?Assessment & Plan:  ? ?Problem List Items Addressed This Visit   ? ?  ? Endocrine  ? Diabetes ([place])  ? Relevant Medications  ? insulin glargine (LANTUS SOLOSTAR) 100 UNIT/ML Solostar Pen  ? insulin aspart (NOVOLOG FLEXPEN) 100 UNIT/ML FlexPen  ? Poorly controlled type 1 diabetes mellitus ([place]) - Primary  ?  Discussed concern related to underlying chronic medical issue and lack of adequate control of blood sugar, at least in recent years.  Discussed that not controlling blood sugars adequately greatly increases risk of complications, particularly relating to kidneys, heart, eyes, nerves, as well as increasing overall mortality risk.  It also places him leads to impaired functioning of his immune system ?Will refill insulin today including Lantus and NovoLog.  We will have patient resume basal insulin dosing and mealtime insulin according to sliding scale ?We will also send prescription for testing supplies ?We will refer patient to new endocrinologist as he was discharged from his most recent endocrinologist office ?  ?  ? Relevant Medications  ? blood glucose meter kit and supplies  ? insulin glargine (LANTUS SOLOSTAR) 100 UNIT/ML Solostar Pen  ? insulin aspart (NOVOLOG FLEXPEN) 100 UNIT/ML FlexPen  ? Other  Relevant Orders  ? Hemoglobin A1c  ? Ambulatory referral to Endocrinology  ?  ? Other  ? Bipolar 1 disorder ([facility]) (Chronic)  ?  Does not currently have a psychiatrist, likely will need to arrange for patient to establish with one locally, discuss further at next visit ?  ?  ? Dog bite of hand  ?  Patient was advised to follow-up with orthopedic surgeon, however he never did.  Will place referral to orthopedic surgeon for patient to establish given continued symptoms, some restriction of range of motion of his right little finger. ?  ?  ? Relevant  Orders  ? Ambulatory referral to Orthopedics  ? ? ? ?Outpatient Encounter Medications as of 08/10/2021  ?Medication Sig  ? acetaminophen (TYLENOL) 500 MG tablet Take 1,000-1,500 mg by mouth every 6 (six) hours as needed for headache or mild pain.  ? blood glucose meter kit and supplies Dispense based on patient and insurance preference. Use up to four times daily as directed. (FOR ICD-10 E10.9, E11.9).Use to check fasting BS and 1 time in the PM.  ? ibuprofen (ADVIL) 200 MG tablet Take 400-600 mg by mouth every 6 (six) hours as needed for headache or mild pain.  ? Accu-Chek FastClix Lancets MISC CHECK SUGAR 10 X DAILY (Patient not taking: Reported on 08/10/2021)  ? insulin aspart (NOVOLOG FLEXPEN) 100 UNIT/ML FlexPen USE UP TO 50 UNITS DAILY.  ? insulin glargine (LANTUS SOLOSTAR) 100 UNIT/ML Solostar Pen Use up to 50 units daily  ? Insulin Pen Needle (BD PEN NEEDLE NANO U/F) 32G X 4 MM MISC USE TO INJECT INSULIN VIA INSULIN PEN 7 TIME A DAY (Patient not taking: Reported on 08/10/2021)  ? Insulin Syringe-Needle U-100 31G X 5/16" 1 ML MISC Use with insulin vial (Patient not taking: Reported on 08/10/2021)  ? [DISCONTINUED] amoxicillin-clavulanate (AUGMENTIN) 875-125 MG tablet Take 1 tablet by mouth every 12 (twelve) hours. (Patient not taking: Reported on 08/10/2021)  ? [DISCONTINUED] glucagon (GLUCAGON EMERGENCY) 1 MG injection Inject 1 mg into the vein once as needed (for  hypoglycemia). (Patient not taking: Reported on 08/10/2021)  ? [DISCONTINUED] glucose blood (ACCU-CHEK GUIDE) test strip USE 6X DAILY TO CHECK GLUCOSE (Patient not taking: Reported on 08/10/2021)  ? [DISCONTINUED] insulin

## 2021-08-11 LAB — HEMOGLOBIN A1C
Est. average glucose Bld gHb Est-mCnc: 352 mg/dL
Hgb A1c MFr Bld: 13.9 % — ABNORMAL HIGH (ref 4.8–5.6)

## 2021-08-11 NOTE — Assessment & Plan Note (Signed)
Does not currently have a psychiatrist, likely will need to arrange for patient to establish with one locally, discuss further at next visit ?

## 2021-08-11 NOTE — Assessment & Plan Note (Signed)
Discussed concern related to underlying chronic medical issue and lack of adequate control of blood sugar, at least in recent years.  Discussed that not controlling blood sugars adequately greatly increases risk of complications, particularly relating to kidneys, heart, eyes, nerves, as well as increasing overall mortality risk.  It also places him leads to impaired functioning of his immune system ?Will refill insulin today including Lantus and NovoLog.  We will have patient resume basal insulin dosing and mealtime insulin according to sliding scale ?We will also send prescription for testing supplies ?We will refer patient to new endocrinologist as he was discharged from his most recent endocrinologist office ?

## 2021-08-11 NOTE — Assessment & Plan Note (Signed)
Patient was advised to follow-up with orthopedic surgeon, however he never did.  Will place referral to orthopedic surgeon for patient to establish given continued symptoms, some restriction of range of motion of his right little finger. ?

## 2021-08-12 ENCOUNTER — Telehealth (HOSPITAL_BASED_OUTPATIENT_CLINIC_OR_DEPARTMENT_OTHER): Payer: Self-pay | Admitting: Family Medicine

## 2021-08-12 NOTE — Telephone Encounter (Signed)
Pt called back regarding lab results. Read pt Labs word for word that provider stated. Asked pt if he had any questions and if he would like to have the [facility] call him regarding these. Pt stated no he understood. ? ?Please advise. ?

## 2021-09-07 ENCOUNTER — Ambulatory Visit (INDEPENDENT_AMBULATORY_CARE_PROVIDER_SITE_OTHER): Payer: Medicaid Other | Admitting: Family Medicine

## 2021-09-07 ENCOUNTER — Encounter (HOSPITAL_BASED_OUTPATIENT_CLINIC_OR_DEPARTMENT_OTHER): Payer: Self-pay | Admitting: Family Medicine

## 2021-09-07 VITALS — BP 139/101 | HR 92 | Ht 68.0 in | Wt 133.6 lb

## 2021-09-07 DIAGNOSIS — E1065 Type 1 diabetes mellitus with hyperglycemia: Secondary | ICD-10-CM | POA: Diagnosis not present

## 2021-09-07 DIAGNOSIS — F319 Bipolar disorder, unspecified: Secondary | ICD-10-CM

## 2021-09-07 MED ORDER — LANTUS SOLOSTAR 100 UNIT/ML ~~LOC~~ SOPN: PEN_INJECTOR | SUBCUTANEOUS | 0 refills | Status: DC

## 2021-09-07 MED ORDER — GLUCOSE BLOOD VI STRP: ORAL_STRIP | 12 refills | Status: DC

## 2021-09-07 MED ORDER — BD PEN NEEDLE NANO U/F 32G X 4 MM MISC: 12 refills | Status: DC

## 2021-09-07 MED ORDER — NOVOLOG FLEXPEN 100 UNIT/ML ~~LOC~~ SOPN: PEN_INJECTOR | SUBCUTANEOUS | 0 refills | Status: DC

## 2021-09-07 NOTE — Assessment & Plan Note (Signed)
Patient would like to have referral to psychiatry to be able to establish.  Reports last time he was able to follow with a psychiatrist was more than 5 years ago ?Referral placed today ?

## 2021-09-07 NOTE — Progress Notes (Signed)
? ? ?  Procedures performed today:   ? ?None. ? ?Independent interpretation of notes and tests performed by another provider:  ? ?None. ? ?Brief History, Exam, Impression, and Recommendations:   ? ?BP (!) 139/101   Pulse 92   Ht '5\' 8"'$  (1.727 m)   Wt 133 lb 9.6 oz (60.6 kg)   SpO2 100%   BMI 20.31 kg/m?  ? ?Poorly controlled type 1 diabetes mellitus ?Generally diabetes has been poorly controlled ?Patient was able to start with insulin including long-acting as well as mealtime insulin.  Reports that this has been adequately controlling his blood sugars.  He did have one episode of low blood sugar where he reports that he took his short acting insulin but did not eat afterwards and had symptomatic hypoglycemia with blood sugar that was in the 40s and he had sweats and tremors.  Blood sugar responded with small snack. ?He was referred to endocrinology previously, has not heard from the office.  Provided patient with information today so that he may reach out to the office to schedule this appointment ?Refill provided today for insulin as well as blood sugar testing supplies ?Most recent hemoglobin A1c was significantly elevated at 13.9% ?No recent urine microalbumin/creatinine ratio, will check today ? ?Bipolar 1 disorder ([place]) ?Patient would like to have referral to psychiatry to be able to establish.  Reports last time he was able to follow with a psychiatrist was more than 5 years ago ?Referral placed today ? ?Plan for follow-up in about 4 to 6 weeks or sooner as needed ? ? ?___________________________________________ ?Aws Shere de [place], MD, ABFM, CAQSM ?Primary Care and Sports Medicine ?[facility] ?

## 2021-09-07 NOTE — Assessment & Plan Note (Addendum)
Generally diabetes has been poorly controlled ?Patient was able to start with insulin including long-acting as well as mealtime insulin.  Reports that this has been adequately controlling his blood sugars.  He did have one episode of low blood sugar where he reports that he took his short acting insulin but did not eat afterwards and had symptomatic hypoglycemia with blood sugar that was in the 40s and he had sweats and tremors.  Blood sugar responded with small snack. ?He was referred to endocrinology previously, has not heard from the office.  Provided patient with information today so that he may reach out to the office to schedule this appointment ?Refill provided today for insulin as well as blood sugar testing supplies ?Most recent hemoglobin A1c was significantly elevated at 13.9% ?No recent urine microalbumin/creatinine ratio, will check today ?

## 2021-09-08 LAB — MICROALBUMIN / CREATININE URINE RATIO
Creatinine, Urine: 21.5 mg/dL
Microalb/Creat Ratio: <14 mg/g creat (ref 0–29)
Microalbumin, Urine: <3 ug/mL

## 2021-10-09 ENCOUNTER — Other Ambulatory Visit (HOSPITAL_BASED_OUTPATIENT_CLINIC_OR_DEPARTMENT_OTHER): Payer: Self-pay | Admitting: Family Medicine

## 2021-10-09 DIAGNOSIS — E1065 Type 1 diabetes mellitus with hyperglycemia: Secondary | ICD-10-CM

## 2021-10-12 ENCOUNTER — Ambulatory Visit (HOSPITAL_COMMUNITY): Payer: Medicaid Other | Admitting: Licensed Clinical Social Worker

## 2021-10-13 ENCOUNTER — Other Ambulatory Visit (HOSPITAL_BASED_OUTPATIENT_CLINIC_OR_DEPARTMENT_OTHER): Payer: Self-pay | Admitting: Family Medicine

## 2021-10-13 DIAGNOSIS — E1065 Type 1 diabetes mellitus with hyperglycemia: Secondary | ICD-10-CM

## 2021-10-19 ENCOUNTER — Ambulatory Visit (HOSPITAL_BASED_OUTPATIENT_CLINIC_OR_DEPARTMENT_OTHER): Payer: Medicaid Other | Admitting: Family Medicine

## 2021-11-08 ENCOUNTER — Other Ambulatory Visit (HOSPITAL_BASED_OUTPATIENT_CLINIC_OR_DEPARTMENT_OTHER): Payer: Self-pay | Admitting: Family Medicine

## 2021-11-08 DIAGNOSIS — E1065 Type 1 diabetes mellitus with hyperglycemia: Secondary | ICD-10-CM

## 2021-11-23 ENCOUNTER — Encounter (HOSPITAL_BASED_OUTPATIENT_CLINIC_OR_DEPARTMENT_OTHER): Payer: Self-pay | Admitting: Family Medicine

## 2021-12-02 ENCOUNTER — Encounter (INDEPENDENT_AMBULATORY_CARE_PROVIDER_SITE_OTHER): Payer: Self-pay

## 2021-12-13 ENCOUNTER — Emergency Department (HOSPITAL_COMMUNITY)
Admission: EM | Admit: 2021-12-13 | Discharge: 2021-12-13 | Discharge status: Against Medical Advice | Payer: Medicaid Other | Attending: Emergency Medicine | Admitting: Emergency Medicine

## 2021-12-13 ENCOUNTER — Encounter (HOSPITAL_COMMUNITY): Payer: Self-pay | Admitting: Emergency Medicine

## 2021-12-13 ENCOUNTER — Other Ambulatory Visit: Payer: Self-pay

## 2021-12-13 DIAGNOSIS — R109 Unspecified abdominal pain: Secondary | ICD-10-CM | POA: Diagnosis present

## 2021-12-13 DIAGNOSIS — Z5321 Procedure and treatment not carried out due to patient leaving prior to being seen by health care provider: Secondary | ICD-10-CM | POA: Insufficient documentation

## 2021-12-13 DIAGNOSIS — E119 Type 2 diabetes mellitus without complications: Secondary | ICD-10-CM | POA: Diagnosis not present

## 2021-12-13 DIAGNOSIS — R112 Nausea with vomiting, unspecified: Secondary | ICD-10-CM | POA: Diagnosis not present

## 2021-12-13 LAB — CBC
HCT: 48.9 % (ref 39.0–52.0)
Hemoglobin: 16.6 g/dL (ref 13.0–17.0)
MCH: 28.9 pg (ref 26.0–34.0)
MCHC: 33.9 g/dL (ref 30.0–36.0)
MCV: 85.2 fL (ref 80.0–100.0)
Platelets: 334 10*3/uL (ref 150–400)
RBC: 5.74 MIL/uL (ref 4.22–5.81)
RDW: 12.5 % (ref 11.5–15.5)
WBC: 9.2 10*3/uL (ref 4.0–10.5)
nRBC: 0 % (ref 0.0–0.2)

## 2021-12-13 LAB — BASIC METABOLIC PANEL
Anion gap: 20 — ABNORMAL HIGH (ref 5–15)
BUN: 23 mg/dL — ABNORMAL HIGH (ref 6–20)
CO2: 21 mmol/L — ABNORMAL LOW (ref 22–32)
Calcium: 9.6 mg/dL (ref 8.9–10.3)
Chloride: 95 mmol/L — ABNORMAL LOW (ref 98–111)
Creatinine, Ser: 1.17 mg/dL (ref 0.61–1.24)
GFR, Estimated: >60 mL/min (ref >60)
Glucose, Bld: 490 mg/dL — ABNORMAL HIGH (ref 70–99)
Potassium: 4.2 mmol/L (ref 3.5–5.1)
Sodium: 136 mmol/L (ref 135–145)

## 2021-12-13 LAB — CBG MONITORING, ED: Glucose-Capillary: 483 mg/dL — ABNORMAL HIGH (ref 70–99)

## 2021-12-13 NOTE — ED Provider Notes (Signed)
Patient left before evaluation shortly after arrival.   Daleen Bo, MD 12/13/21 [phone]

## 2021-12-13 NOTE — ED Notes (Signed)
Patient standing outside of door stating he is ready to leave and remove IV. Patient had already removed blood pressure cuff, and leads. Patient was about to remove IV when this nurse walked in. Patient repeatedly stating he is not staying here and to remove the IV. Patient refusewd to sign AMA forms. IV was removed and Patient was escorted out. Eulis Foster, MD notified.

## 2021-12-13 NOTE — ED Triage Notes (Signed)
Pt reports abd pain, N/V since yesterday. Pt hx diabetes.

## 2022-01-17 ENCOUNTER — Ambulatory Visit (INDEPENDENT_AMBULATORY_CARE_PROVIDER_SITE_OTHER): Payer: Medicaid Other | Admitting: Family Medicine

## 2022-01-17 ENCOUNTER — Encounter (HOSPITAL_BASED_OUTPATIENT_CLINIC_OR_DEPARTMENT_OTHER): Payer: Self-pay | Admitting: Family Medicine

## 2022-01-17 DIAGNOSIS — E1065 Type 1 diabetes mellitus with hyperglycemia: Secondary | ICD-10-CM | POA: Diagnosis not present

## 2022-01-17 DIAGNOSIS — M25571 Pain in right ankle and joints of right foot: Secondary | ICD-10-CM

## 2022-01-17 DIAGNOSIS — Z23 Encounter for immunization: Secondary | ICD-10-CM | POA: Diagnosis not present

## 2022-01-17 LAB — HEMOGLOBIN A1C
Est. average glucose Bld gHb Est-mCnc: 312 mg/dL
Hgb A1c MFr Bld: 12.5 % — ABNORMAL HIGH (ref 4.8–5.6)

## 2022-01-17 MED ORDER — NOVOLOG FLEXPEN 100 UNIT/ML ~~LOC~~ SOPN: PEN_INJECTOR | SUBCUTANEOUS | 0 refills | Status: DC

## 2022-01-17 MED ORDER — LANTUS SOLOSTAR 100 UNIT/ML ~~LOC~~ SOPN: 32.0000 [IU] | PEN_INJECTOR | Freq: Every day | SUBCUTANEOUS | 0 refills | Status: DC

## 2022-01-17 NOTE — Assessment & Plan Note (Signed)
Patient presents for follow-up.  Requesting refills of NovoLog and Lantus today.  Reports that he currently utilizes 32 units of Lantus nightly.  As for NovoLog, instructions are to use according to sliding scale, however he reports that he generally will administer number of units "based on how I feel" and what food he is eating.  He previously has been referred to endocrinology to establish with new specialist as prior endocrinologist had discharged him from the practice due to missed appointments/noncompliance.  Unfortunately, he still has yet to schedule appointment with endocrinologist.  He previously has been provided with their contact information. Medications refilled today.  We will check hemoglobin A1c, prior reading elevated at 13.9%. Again stressed importance of establishing with endocrinologist locally, again provided with contact information for office today

## 2022-01-17 NOTE — Progress Notes (Signed)
    Procedures performed today:    None.  Independent interpretation of notes and tests performed by another provider:   None.  Brief History, Exam, Impression, and Recommendations:    BP (!) 137/99   Pulse 81   Ht '5\' 8"'$  (1.727 m)   Wt 130 lb 1.6 oz (59 kg)   SpO2 95%   BMI 19.78 kg/m   Poorly controlled type 1 diabetes mellitus Patient presents for follow-up.  Requesting refills of NovoLog and Lantus today.  Reports that he currently utilizes 32 units of Lantus nightly.  As for NovoLog, instructions are to use according to sliding scale, however he reports that he generally will administer number of units "based on how I feel" and what food he is eating.  He previously has been referred to endocrinology to establish with new specialist as prior endocrinologist had discharged him from the practice due to missed appointments/noncompliance.  Unfortunately, he still has yet to schedule appointment with endocrinologist.  He previously has been provided with their contact information. Medications refilled today.  We will check hemoglobin A1c, prior reading elevated at 13.9%. Again stressed importance of establishing with endocrinologist locally, again provided with contact information for office today  Right ankle pain Reports being diagnosed with fracture in the past and did have evaluation with orthopedic specialist through [facility].  Unfortunately, he did not follow-up with them and, while he was in a cast, he ended up removing this on his own and never followed up.  He is reporting having some right ankle discomfort now and is requesting referral to provider to have further evaluation.  Referral placed today for patient to follow-up with [facility] for consideration of repeating imaging and reviewing any recommended treatments  Return in about 4 months (around 05/19/2022) for DM. Patient due for seasonal influenza vaccine today, administered in  office   ___________________________________________ Artemis Koller de [place], MD, ABFM, CAQSM Primary Care and [facility]

## 2022-01-17 NOTE — Assessment & Plan Note (Signed)
Reports being diagnosed with fracture in the past and did have evaluation with orthopedic specialist through [facility].  Unfortunately, he did not follow-up with them and, while he was in a cast, he ended up removing this on his own and never followed up.  He is reporting having some right ankle discomfort now and is requesting referral to provider to have further evaluation.  Referral placed today for patient to follow-up with [facility] for consideration of repeating imaging and reviewing any recommended treatments

## 2022-01-17 NOTE — Patient Instructions (Signed)
  Medication Instructions:  Your physician recommends that you continue on your current medications as directed. Please refer to the Current Medication list given to you today. --If you need a refill on any your medications before your next appointment, please call your pharmacy first. If no refills are authorized on file call the office.-- Lab Work: Your physician has recommended that you have lab work today: Yes If you have labs (blood work) drawn today and your tests are completely normal, you will receive your results via [facility] a phone call from our staff.  Please ensure you check your voicemail in the event that you authorized detailed messages to be left on a delegated number. If you have any lab test that is abnormal or we need to change your treatment, we will call you to review the results.  Referrals/Procedures/Imaging: Yes  Follow-Up: Your next appointment:   Your physician recommends that you schedule a follow-up appointment in: 3-4 months with Dr. de [place].   You will receive a text message or e-mail with a link to a survey about your care and experience with [place] today! We would greatly appreciate your feedback!   Thanks for letting us be apart of your health journey!!  Primary Care and Sports Medicine   Dr. Arlina Robes [place]   We encourage you to activate your patient portal called "MyChart".  Sign up information is provided on this After Visit Summary.  MyChart is used to connect with patients for Virtual Visits (Telemedicine).  Patients are able to view lab/test results, encounter notes, upcoming appointments, etc.  Non-urgent messages can be sent to your provider as well. To learn more about what you can do with MyChart, please visit --  [email or web address].

## 2022-01-17 NOTE — Addendum Note (Signed)
Addended by: Lowella Bandy on: 01/17/2022 03:56 PM   Modules accepted: Orders

## 2022-05-23 ENCOUNTER — Other Ambulatory Visit (HOSPITAL_BASED_OUTPATIENT_CLINIC_OR_DEPARTMENT_OTHER): Payer: Self-pay | Admitting: Family Medicine

## 2022-05-23 ENCOUNTER — Ambulatory Visit (HOSPITAL_BASED_OUTPATIENT_CLINIC_OR_DEPARTMENT_OTHER): Payer: Medicaid Other | Admitting: Family Medicine

## 2022-05-23 ENCOUNTER — Other Ambulatory Visit (HOSPITAL_BASED_OUTPATIENT_CLINIC_OR_DEPARTMENT_OTHER): Payer: Self-pay

## 2022-05-23 DIAGNOSIS — E1065 Type 1 diabetes mellitus with hyperglycemia: Secondary | ICD-10-CM

## 2022-05-23 MED ORDER — NOVOLOG FLEXPEN 100 UNIT/ML ~~LOC~~ SOPN: PEN_INJECTOR | SUBCUTANEOUS | 0 refills | Status: AC

## 2022-05-23 MED ORDER — LANTUS SOLOSTAR 100 UNIT/ML ~~LOC~~ SOPN: 32.0000 [IU] | PEN_INJECTOR | Freq: Every day | SUBCUTANEOUS | 0 refills | Status: DC

## 2022-05-23 MED ORDER — GLUCOSE BLOOD VI STRP: ORAL_STRIP | 12 refills | Status: DC

## 2022-05-25 IMAGING — DX DG CHEST 1V PORT
1 series · 1 of 1 positions shown · non-contrast
Comparison: Chest radiographs 06/13/2018 and earlier.

CLINICAL DATA: 20-year-old male with shortness of breath.
Hyperglycemia.

EXAM:
PORTABLE CHEST 1 VIEW

[chest]
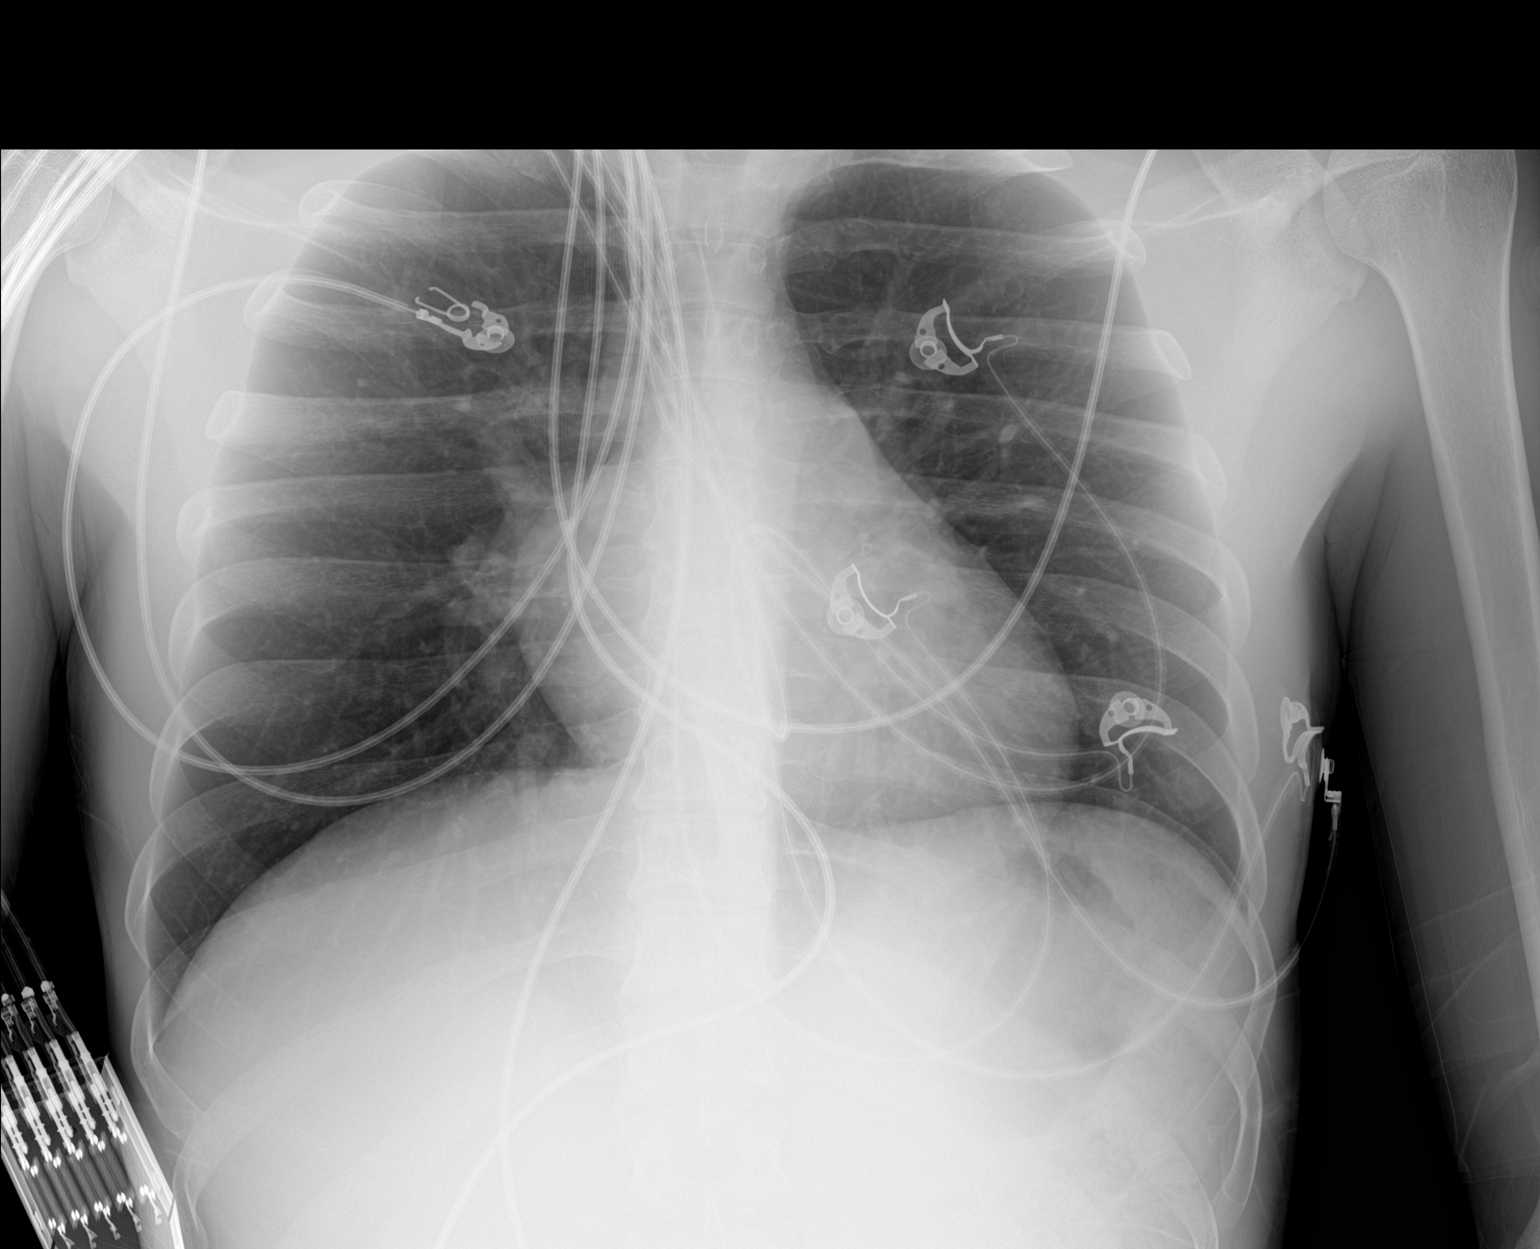

[1 of 1 positions shown; findings below may reference images not displayed]

FINDINGS: Portable AP semi upright view at 1871 hours. Normal lung volumes and
mediastinal contours. Visualized tracheal air column is within
normal limits. Allowing for portable technique the lungs are clear.
No pneumothorax or pleural effusion. No osseous abnormality
identified. Negative visible bowel gas.
IMPRESSION: Negative portable chest.

## 2022-07-17 IMAGING — DX DG FOOT COMPLETE 3+V*R*
3 series · 3 of 3 positions shown · non-contrast
Comparison: None.

CLINICAL DATA: Foot pain

EXAM:
RIGHT FOOT COMPLETE - 3+ VIEW

[foot ap]
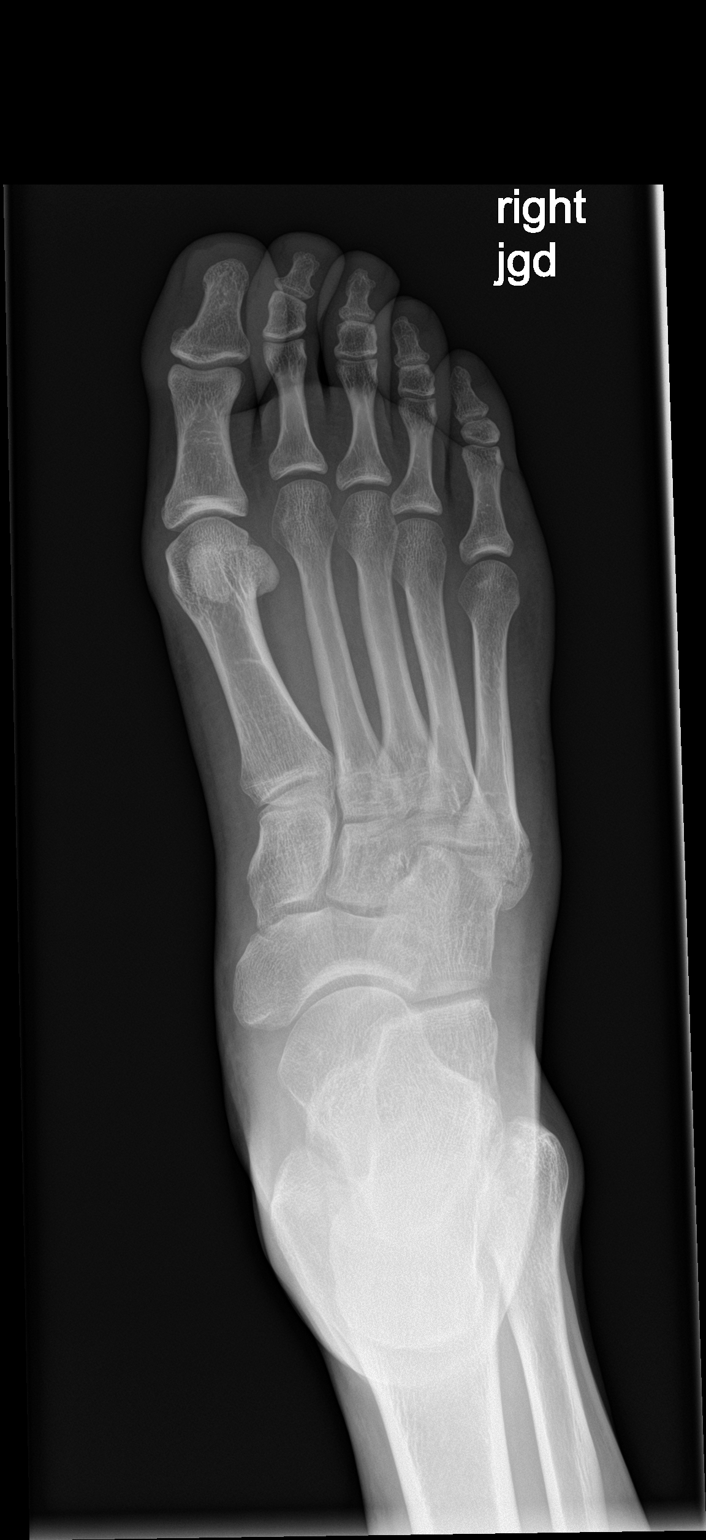

[foot obl]
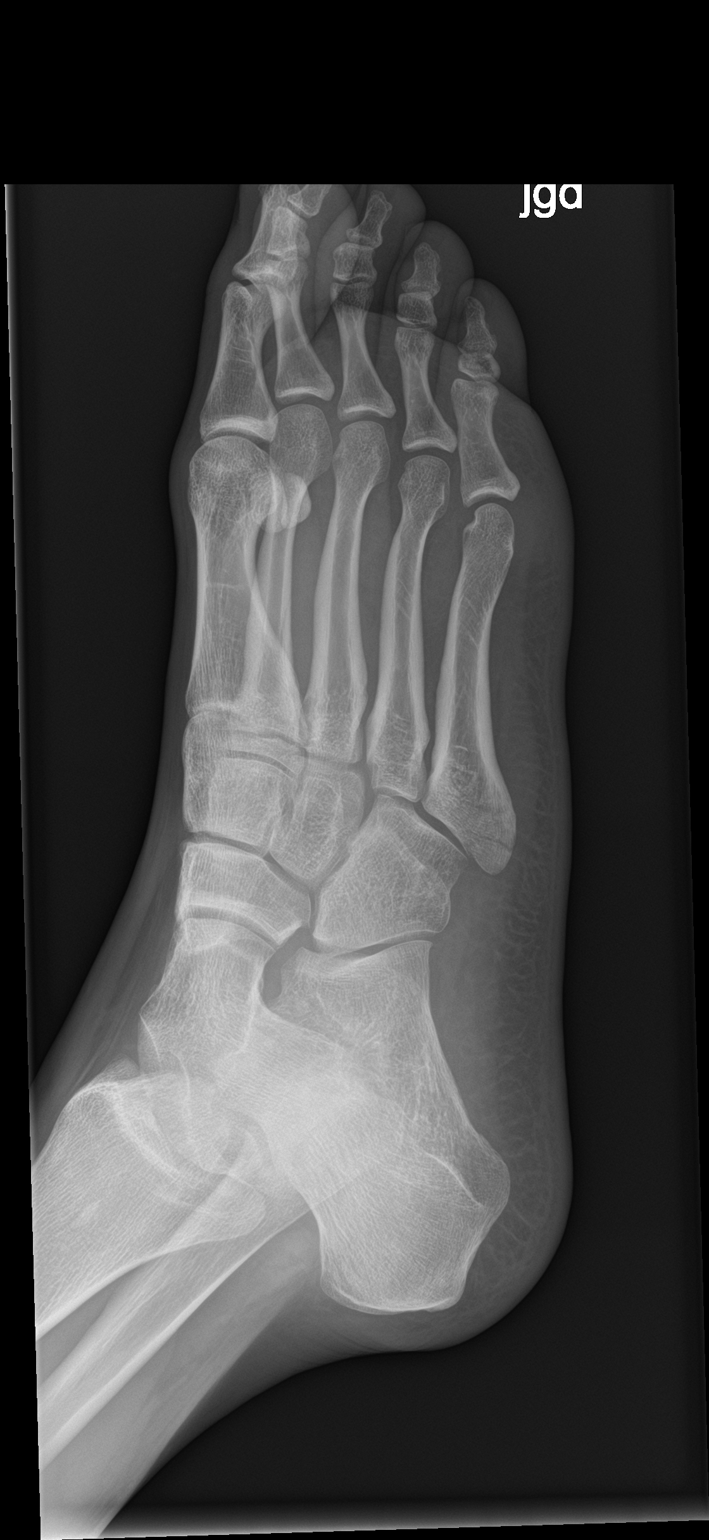

[foot lat]
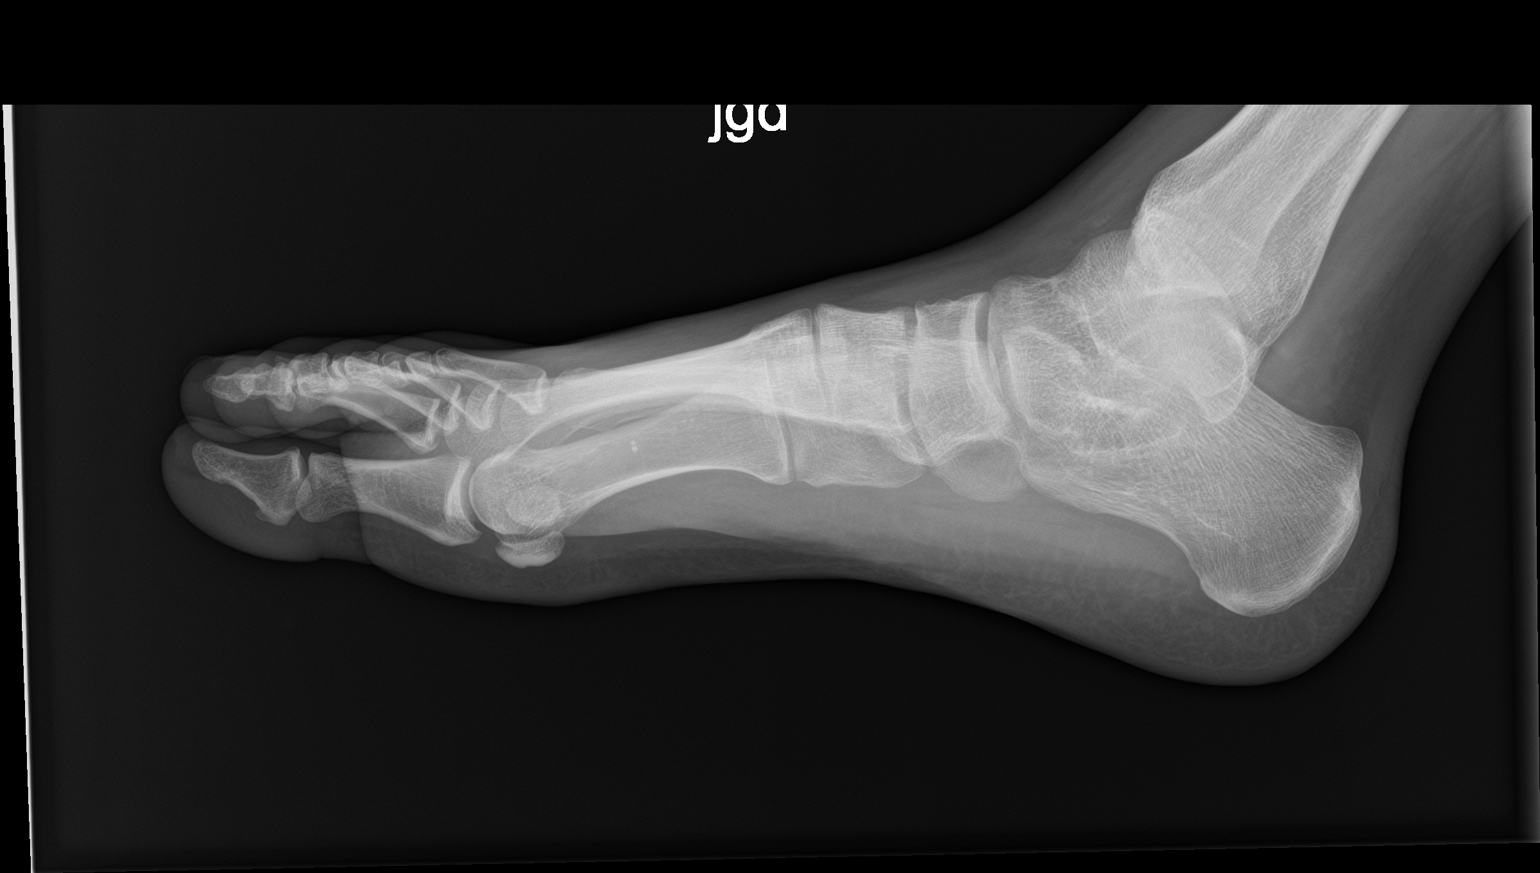

[3 of 3 positions shown; findings below may reference images not displayed]

FINDINGS: Fracture through the fifth metatarsal styloid with two transverse
fracture lines, the more proximal fracture line involves the
articular surface. No evidence of arthropathy. Soft tissue swelling
of the lateral foot.
IMPRESSION: Avulsion fracture of the proximal fifth metatarsal.

## 2022-09-07 ENCOUNTER — Other Ambulatory Visit (HOSPITAL_BASED_OUTPATIENT_CLINIC_OR_DEPARTMENT_OTHER): Payer: Self-pay | Admitting: Family Medicine

## 2022-09-07 DIAGNOSIS — E1065 Type 1 diabetes mellitus with hyperglycemia: Secondary | ICD-10-CM

## 2022-10-13 ENCOUNTER — Telehealth (HOSPITAL_BASED_OUTPATIENT_CLINIC_OR_DEPARTMENT_OTHER): Payer: Self-pay | Admitting: Family Medicine

## 2022-10-13 NOTE — Telephone Encounter (Signed)
lvm to call back to schedule diabetic retina screening and schedule f/u DM appt.

## 2022-11-15 ENCOUNTER — Encounter (HOSPITAL_BASED_OUTPATIENT_CLINIC_OR_DEPARTMENT_OTHER): Payer: Self-pay | Admitting: Family Medicine

## 2022-11-15 ENCOUNTER — Ambulatory Visit (INDEPENDENT_AMBULATORY_CARE_PROVIDER_SITE_OTHER): Payer: Medicaid Other | Admitting: Family Medicine

## 2022-11-15 VITALS — BP 138/78 | HR 77 | Ht 68.0 in | Wt 127.6 lb

## 2022-11-15 DIAGNOSIS — E1065 Type 1 diabetes mellitus with hyperglycemia: Secondary | ICD-10-CM | POA: Diagnosis not present

## 2022-11-15 DIAGNOSIS — H539 Unspecified visual disturbance: Secondary | ICD-10-CM | POA: Diagnosis not present

## 2022-11-15 LAB — HM DIABETES EYE EXAM

## 2022-11-15 NOTE — Patient Instructions (Addendum)
[facility]- GO DIRECTLY FROM THE OFFICE!   Located in: [facility] Address: [address] [place], [place], [place] [address] Hours:  Open ? Closes 5?PM Phone: [phone]

## 2022-11-15 NOTE — Progress Notes (Signed)
Acute Care Office Visit  Subjective:   Rodney [name] 07/25/2000 11/15/2022  Chief Complaint  Patient presents with   Eye Problem    Pt states he started having floaters with his left eye which started about a week ago. Stated he has also had a headache about the time that the floaters began.    HPI: Patient reports "dark floaters" began in his left eye appro. 1 week prior. Vision changes progressed and approx. 2-3 days prior he noticed a "flashing in his left eye". He states feels like a "flashlight" present in my left eye that is constant. He states his vision is blurred and sometimes seeing "double". He states this double vision and blurring improved with blinking. "Flashing of light" does not resolve.  He reports headaches, nausea and dizziness occurred approx. 1 week ago when floaters began. States nausea has been present for "a while" due to his uncontrolled DM. Denies vomiting. Does not wear glasses or contacts. Reports mild vision changes that did not require correction when he was a child. Denies hx of eye surgeries. He has never had a diabetic eye exam. Does not see an endocrinologist although he has been referred several times by PCP.   Patient does have Type 1 DM which is uncontrolled. His last visit to his PCP was Sept 18, 2023.   Patient takes Novolog Sliding Scale ACHS and Lantus 32 units at bedtime. Does not check blood sugars during the day or at night.   Lab Results  Component Value Date   HGBA1C 12.5 (H) 01/17/2022      The following portions of the patient's history were reviewed and updated as appropriate: past medical history, past surgical history, family history, social history, allergies, medications, and problem list.   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   GERD (gastroesophageal reflux disease) 09/15/2015   Unintentional weight loss 09/15/2015   Closed Bennett's fracture of right thumb 08/04/2015   Injury of right hand 07/28/2015   Diabetic ketoacidosis without coma associated with type 1 diabetes mellitus (HCC)    Ketonuria 04/17/2014   Poorly controlled type 1 diabetes mellitus (HCC) 04/17/2014   Hyperglycemia due to type 1 diabetes mellitus (HCC) 03/18/2014   Diabetic ketoacidosis (HCC) 05/20/2013   Dehydration 05/06/2013   Non compliance with medical treatment 05/06/2013   DKA (diabetic ketoacidoses) (HCC) 05/05/2013   Diabetes (HCC) 03/25/2013   Mild Diabetic ketoacidosis 03/25/2013   Nausea with vomiting 03/25/2013   Adjustment reaction 12/05/2012   DKA, type 1 (HCC) 12/04/2012   Non compliance w medication regimen 11/21/2012   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HCC) 08/02/2012   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   Fracture, nasal 08/24/2011    Class: Acute   Goiter    Hypoglycemia associated with diabetes (HCC) 05/23/2011   Thyroiditis, 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/23/2011   Diabetic autonomic neuropathy (HCC) 05/23/2011   Tachycardia 05/23/2011   Type I (juvenile type) diabetes mellitus without mention of complication, uncontrolled 08/30/2010   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   Past Medical History:  Diagnosis Date   Abrasion of arm, right 08/22/2011   Bipolar 1 disorder (HCC)    Depression  Diabetes mellitus    Type I - insulin pump   Nasal fracture 08/13/2011   was hit in nose while playing basketball   Seizures (HCC)    x 3 - due to low blood sugar - last time 5-6 yrs. ago   Past Surgical History:  Procedure Laterality Date   APPENDECTOMY     CLOSED REDUCTION NASAL FRACTURE  08/24/2011   Procedure: CLOSED REDUCTION NASAL FRACTURE;  Surgeon:  Osborn Coho, MD;  Location: [facility];  Service: ENT;  Laterality: N/A;   Family History  Problem Relation Age of Onset   Heart disease Mother        MI at age 6   Diabetes Paternal Grandfather        type 2   Hypertension Paternal Grandfather    Heart disease Paternal Grandfather    Hepatitis C Brother        older brother (incarcerated)   Hypertension Father    Outpatient Medications Prior to Visit  Medication Sig Dispense Refill   Accu-Chek FastClix Lancets MISC      ACCU-CHEK GUIDE test strip USE AS INSTRUCTED 100 strip 12   acetaminophen (TYLENOL) 500 MG tablet Take 1,000-1,500 mg by mouth every 6 (six) hours as needed for headache or mild pain.     Blood Glucose Monitoring Suppl (ACCU-CHEK GUIDE ME) w/Device KIT USE UP TO FOUR TIMES DAILY AS DIRECTED. USE TO CHECK FASTING SUGAR AND 1 TIME IN THE PM. 1 kit 0   ibuprofen (ADVIL) 200 MG tablet Take 400-600 mg by mouth every 6 (six) hours as needed for headache or mild pain.     insulin aspart (NOVOLOG FLEXPEN) 100 UNIT/ML FlexPen Sliding scale. Inject three times daily with meals 100 mL 0   insulin glargine (LANTUS SOLOSTAR) 100 UNIT/ML Solostar Pen Inject 32 Units into the skin at bedtime. 100 mL 0   Insulin Pen Needle (BD PEN NEEDLE NANO U/F) 32G X 4 MM MISC USE TO INJECT INSULIN VIA INSULIN PEN 7 TIME A DAY 250 each 12   Insulin Syringe-Needle U-100 31G X 5/16" 1 ML MISC Use with insulin vial 100 each 0   No facility-administered medications prior to visit.   No Known Allergies   ROS: A complete ROS was performed with pertinent positives/negatives noted in the HPI. The remainder of the ROS are negative.    Objective:   Today's Vitals   11/15/22 1310 11/15/22 1330  BP: (!) 137/93 138/78  Pulse: 77   SpO2: 100%   Weight: 127 lb 9.6 oz (57.9 kg)   Height: 5\' 8"  (1.727 m)     GENERAL: Well-appearing, in NAD. Well nourished.  SKIN: Pink, warm and dry. No rash, lesion, ulceration, or ecchymoses.   Head: Normocephalic. NECK: Trachea midline. Full ROM w/o pain or tenderness. No lymphadenopathy.  EARS: Tympanic membranes are intact, translucent without bulging and without drainage. Appropriate landmarks visualized.  EYES: Conjunctiva clear without exudates. EOMI, PERRL, no drainage present. Floaters present to left eye on limited fundus exam. Unable to visualize vessels to assess for nicking. Pt unable to tolerate light exposure due to "burning" and "tearing" anytime light was applied to left eye.  NOSE: Septum midline w/o deformity. Nares patent, mucosa pink and non-inflamed w/o drainage. No sinus tenderness.  RESPIRATORY: Chest wall symmetrical. Respirations even and non-labored.  MSK: Muscle tone and strength appropriate for age. Joints w/o tenderness, redness, or swelling.  EXTREMITIES: Without clubbing, cyanosis, or edema.  NEUROLOGIC: No motor or sensory deficits. Steady,  even gait. C2-C12 intact.  PSYCH/MENTAL STATUS: Alert, oriented x 3. Cooperative, appropriate mood and affect.    No results found for any visits on 11/15/22.    Assessment & Plan:  1. Vision changes Provider concerned for possible partial retinal detachment. Poorly controlled Type 1 DM likely contributing to retinopathy. [facility] called and patient informed to go to their office directly for possible retinal detachment. He is agreeable and ambulated out of office to go to [facility].   - Ambulatory referral to Ophthalmology  2. Poorly controlled type 1 diabetes mellitus (HCC) Over 1 year since DM has been evaluated. Endocrinology and Diabetic education referrals placed. Recommend patient return within 1 week or ASAP for fasting labs and visit for control of DM.   - Ambulatory referral to Ophthalmology - Ambulatory referral to Endocrinology - Referral to Nutrition and Diabetes Services - Lipid panel; Future - CBC with Differential/Platelet; Future - Comprehensive metabolic panel; Future - Hemoglobin A1c;  Future - Microalbumin / creatinine urine ratio; Future   Return in about 1 week (around 11/22/2022) for Follow up Type 1 DM (Fasting labs prior) .    Patient to reach out to office if new, worrisome, or unresolved symptoms arise or if no improvement in patient's condition. Patient verbalized understanding and is agreeable to treatment plan. All questions answered to patient's satisfaction.   Of note, portions of this note may have been created with voice recognition software [employer]ic[employer], medical). While this note has been edited for accuracy, occasional wrong-word or 'sound-a-like' substitutions may have occurred due to the inherent limitations of voice recognition software.  Yolanda Manges, FNP

## 2022-11-16 ENCOUNTER — Encounter (HOSPITAL_BASED_OUTPATIENT_CLINIC_OR_DEPARTMENT_OTHER): Payer: Self-pay | Admitting: Family Medicine

## 2022-11-16 ENCOUNTER — Ambulatory Visit (INDEPENDENT_AMBULATORY_CARE_PROVIDER_SITE_OTHER): Payer: Medicaid Other | Admitting: Ophthalmology

## 2022-11-16 ENCOUNTER — Encounter (INDEPENDENT_AMBULATORY_CARE_PROVIDER_SITE_OTHER): Payer: Self-pay | Admitting: Ophthalmology

## 2022-11-16 VITALS — BP 146/95 | HR 71

## 2022-11-16 DIAGNOSIS — I10 Essential (primary) hypertension: Secondary | ICD-10-CM

## 2022-11-16 DIAGNOSIS — H34232 Retinal artery branch occlusion, left eye: Secondary | ICD-10-CM | POA: Diagnosis not present

## 2022-11-16 DIAGNOSIS — E113299 Type 2 diabetes mellitus with mild nonproliferative diabetic retinopathy without macular edema, unspecified eye: Secondary | ICD-10-CM | POA: Insufficient documentation

## 2022-11-16 DIAGNOSIS — E1039 Type 1 diabetes mellitus with other diabetic ophthalmic complication: Secondary | ICD-10-CM | POA: Insufficient documentation

## 2022-11-16 DIAGNOSIS — H35033 Hypertensive retinopathy, bilateral: Secondary | ICD-10-CM | POA: Diagnosis not present

## 2022-11-16 DIAGNOSIS — E113391 Type 2 diabetes mellitus with moderate nonproliferative diabetic retinopathy without macular edema, right eye: Secondary | ICD-10-CM

## 2022-11-16 DIAGNOSIS — E11311 Type 2 diabetes mellitus with unspecified diabetic retinopathy with macular edema: Secondary | ICD-10-CM | POA: Insufficient documentation

## 2022-11-16 NOTE — Progress Notes (Addendum)
[facility] - Clinic Note  11/16/2022   CHIEF COMPLAINT Patient presents for Retina Evaluation  HISTORY OF PRESENT ILLNESS: Rodney [name] is a 22 y.o. male who presents to the clinic today for:  HPI     Retina Evaluation   In left eye.  This started 1 week ago.  Duration of 1 week.  Associated Symptoms Flashes, Floaters and Blind Spot.  Context:  distance vision.  Response to treatment was no improvement.  I, the attending physician,  performed the HPI with the patient and updated documentation appropriately.        Comments   New pt ret eval from Dr. [name] -poss ret detachment OS. Pt states 1 week ago he started seeing a floater in OS for a couple days then a few days ago he said OS suddenly had a 'white light' and is only able to see sections of VA in OS. Pt is diabetic, unsure of last A1C. Pt hasn't been to the doctor, supposed to go back next week. On insulin.       Last edited by Rennis Chris, MD on 11/16/2022  1:20 PM.    Pt states last Thursday, he noticed new dark floaters in his left eye, he states it then changed and looked like a light was shining in his vision, since then it hasn't gotten better or worse, pt saw his PCP yesterday and was referred to Dr. [name] who sent him here, pt states he has never seen anything like this before, pt is a type 1 diabetic but does not have good control, he takes his insulin, but does not check his blood sugar, he does count his carbs  Referring physician: [name], MD [address] [place],  [place] [address]  HISTORICAL INFORMATION:  Selected notes from the MEDICAL RECORD NUMBER Referred by Dr. [name] for subacute vision loss OS, inferior visual field LEE:  Ocular Hx- PMH-   CURRENT MEDICATIONS: No current outpatient medications on file. (Ophthalmic Drugs)   No current facility-administered medications for this visit. (Ophthalmic Drugs)   Current Outpatient Medications (Other)  Medication Sig    Accu-Chek FastClix Lancets MISC    ACCU-CHEK GUIDE test strip USE AS INSTRUCTED   acetaminophen (TYLENOL) 500 MG tablet Take 1,000-1,500 mg by mouth every 6 (six) hours as needed for headache or mild pain.   Blood Glucose Monitoring Suppl (ACCU-CHEK GUIDE ME) w/Device KIT USE UP TO FOUR TIMES DAILY AS DIRECTED. USE TO CHECK FASTING SUGAR AND 1 TIME IN THE PM.   ibuprofen (ADVIL) 200 MG tablet Take 400-600 mg by mouth every 6 (six) hours as needed for headache or mild pain.   insulin aspart (NOVOLOG FLEXPEN) 100 UNIT/ML FlexPen Sliding scale. Inject three times daily with meals   insulin glargine (LANTUS SOLOSTAR) 100 UNIT/ML Solostar Pen Inject 32 Units into the skin at bedtime.   Insulin Pen Needle (BD PEN NEEDLE NANO U/F) 32G X 4 MM MISC USE TO INJECT INSULIN VIA INSULIN PEN 7 TIME A DAY   Insulin Syringe-Needle U-100 31G X 5/16" 1 ML MISC Use with insulin vial   No current facility-administered medications for this visit. (Other)   REVIEW OF SYSTEMS: ROS   Positive for: Endocrine, Eyes Negative for: Constitutional, Gastrointestinal, Neurological, Skin, Genitourinary, Musculoskeletal, HENT, Cardiovascular, Respiratory, Psychiatric, Allergic/Imm, Heme/Lymph Last edited by Thompson Grayer, COT on 11/16/2022  8:57 AM.     ALLERGIES No Known Allergies PAST MEDICAL HISTORY Past Medical History:  Diagnosis Date   Abrasion of arm, right 08/22/2011   Bipolar 1 disorder (HCC)    Depression    Diabetes mellitus    Type I - insulin pump   Nasal fracture 08/13/2011   was hit in nose while playing basketball   Seizures (HCC)    x 3 - due to low blood sugar - last time 5-6 yrs. ago   Past Surgical History:  Procedure Laterality Date   APPENDECTOMY     CLOSED REDUCTION NASAL FRACTURE  08/24/2011   Procedure: CLOSED REDUCTION NASAL FRACTURE;  Surgeon: Osborn Coho, MD;  Location: [facility];  Service: ENT;  Laterality: N/A;   FAMILY HISTORY Family History  Problem  Relation Age of Onset   Heart disease Mother        MI at age 40   Diabetes Paternal Grandfather        type 2   Hypertension Paternal Grandfather    Heart disease Paternal Grandfather    Hepatitis C Brother        older brother (incarcerated)   Hypertension Father    SOCIAL HISTORY Social History   Tobacco Use   Smoking status: Every Day    Current packs/day: 2.00    Average packs/day: 2.0 packs/day for 6.5 years (13.1 ttl pk-yrs)    Types: Cigarettes    Start date: 2018   Smokeless tobacco: Never   Tobacco comments:    Currently smoking 1ppd as of 11/15/22 ep  Vaping Use   Vaping status: Never Used  Substance Use Topics   Alcohol use: Not Currently   Drug use: Not Currently    Types: Marijuana       OPHTHALMIC EXAM:  Base Eye Exam     Visual Acuity (Snellen - Linear)       Right Left   Dist [place] 20/30 20/300   Dist ph [place] 20/25 -2 20/150 -2  "Can only see top of some letters"        Tonometry (Tonopen, 9:03 AM)       Right Left   Pressure 12 14         Pupils       Dark Light Shape React APD   Right 3 2 Round Brisk None   Left 3 2 Round Brisk None         Visual Fields       Left Right    Full Full         Extraocular Movement       Right Left    Full, Ortho Full, Ortho         Neuro/Psych     Oriented x3: Yes   Mood/Affect: Normal         Dilation     Both eyes: 1.0% Mydriacyl, 2.5% Phenylephrine @ 9:08 AM           Slit Lamp and Fundus Exam     Slit Lamp Exam       Right Left   Lids/Lashes Normal Normal   Conjunctiva/Sclera White and quiet White and quiet   Cornea Clear Clear   Anterior Chamber deep and clear deep and clear   Iris Round and dilated, No NVI Round and dilated, No NVI   Lens Clear Clear   Anterior Vitreous Normal Normal         Fundus Exam       Right Left   Disc Pink and Sharp, no NVD Pink and Sharp, no NVD  C/D Ratio 0.2 0.3   Macula Flat, Good foveal reflex, rare MA Good foveal  reflex, segmental retinal whitening superior and temporal macula, scattered MA / DBH   Vessels attenuated, copper wiring, mild tortuosity, no NV attenuated, Tortuous, severe arteriolar attenuation superior macula, mild boxcarring, no obvious plaque / emboli -- superior BRAO   Periphery Attached, scattered MA / DBH greatest posteriorly Attached, scattered MA / DBH / CWS greatest posteriorly           Refraction     Manifest Refraction       Sphere Cylinder Axis Dist VA   Right       Left -1.25 +0.75 180 20/150-2           IMAGING AND PROCEDURES  Imaging and Procedures for 11/16/2022  OCT, Retina - OU - Both Eyes       Right Eye Quality was good. Central Foveal Thickness: 311. Progression has no prior data. Findings include normal foveal contour, no IRF, no SRF.   Left Eye Quality was good. Central Foveal Thickness: 365. Progression has no prior data. Findings include normal foveal contour, no IRF, no SRF, intraretinal hyper-reflective material (Focal retinal thickening superior fovea and macula, bright IRHM superior macula, old IRA IT macula seen best on widefield).   Notes *Images captured and stored on drive  Diagnosis / Impression:  OD: NFP, no IRF/SRF OS: Focal retinal thickening superior fovea and macula, bright IRHM superior macula, old IRA IT macula seen best on widefield -- Acute on chronic BRAO  Clinical management:  See below  Abbreviations: NFP - Normal foveal profile. CME - cystoid macular edema. PED - pigment epithelial detachment. IRF - intraretinal fluid. SRF - subretinal fluid. EZ - ellipsoid zone. ERM - epiretinal membrane. ORA - outer retinal atrophy. ORT - outer retinal tubulation. SRHM - subretinal hyper-reflective material. IRHM - intraretinal hyper-reflective material      Fluorescein Angiography Optos (Transit OS)       Right Eye Progression has no prior data. Early phase findings include microaneurysm. Mid/Late phase findings include leakage,  microaneurysm (No NV, clusters of leaking MA posteriorly about the disc).   Left Eye Progression has no prior data. Early phase findings include microaneurysm, vascular perfusion defect. Mid/Late phase findings include leakage, microaneurysm, vascular perfusion defect (No NV, large vascular perfusion defect ST macula and temporal periphery, leaking MA, +perivascular leakage greatest along ST arcades).   Notes **Images stored on drive**  Impression: OD: moderate NPDR -- No NV, clusters of leaking MA posteriorly about the disc OS: BRAO -- No NV, large vascular perfusion defect ST macula and temporal periphery, leaking MA, +perivascular leakage greatest along ST arcades           ASSESSMENT/PLAN:   ICD-10-CM   1. 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 Essential hypertension  I10     4. Hypertensive retinopathy of both eyes  H35.033 Fluorescein Angiography Optos (Transit OS)     1. BRAO (branch retinal artery occlusion) OS - pt reports superior visual field loss OS with onset Thursday, 07.11.24 - exam shows severe arteriolar attenuation and segment of retinal whitening superior macula - OCT shows bright intraretinal hyperreflective signal in area of retinal whitening and also old inner retinal atrophy  suggestive of acute on chronic BRAO - FA 07.17.24 shows vascular nonperfusion superior and temporal macula  - findings consistent with BRAO OS affecting superior macula - discussed findings, prognosis - no retinal or ophthalmic interventions indicated or recommended, but recommend optimization of cardiovascular risk factors and work-up for potential embolic sources and possible hypercoagulable state - Recommend the following bloodwork:  complete  blood count (CBC) with differential  prothrombin time/activated partial thromboplastin time (PT/PTT) consider lipid profile, antinuclear antibody (ANA), rheumatoid factor, syphilis testing (RPR or VDRL and FTA-ABS or treponemal-specific assay), serum protein electrophoresis, hemoglobin electrophoresis, and further evaluation for hypercoagulable state: antiphospholipid syndrome, factor V Leiden, activated protein C resistance, hyperhomocysteinemia, protein C and S deficiency, antithrombin III mutation, prothrombin mutation, etc. - Carotid artery evaluation by duplex Doppler US. - Cardiac evaluation with electrocardiography (ECG), echocardiography, and possibly Holter monitoring. - consider MRI brain for screening of subclinical emboli to brain - will discuss case with PCP to try to coordinate appropriate work-up - f/u in 3-4 wks  2. Moderate nonproliferative diabetic retinopathy w/o DME OU  - A1c: 12.5 on 09.18.24 - The incidence, risk factors for progression, natural history and treatment options for diabetic retinopathy were discussed with patient.   - The need for close monitoring of blood glucose, blood pressure, and serum lipids, avoiding cigarette or any type of tobacco, and the need for long term follow up was also discussed with patient. - exam shows scattered MA/DBH OU; no NV  - FA (07.17.24) shows no NV, clusters of leaking MA posteriorly about the disc - OCT without diabetic macular edema OU - f/u in 2-3 mos -- DFE/OCT  3,4. Hypertensive retinopathy OU  - BP in office today: RA: 146/95 LA: 147/101 - discussed importance of tight BP control and likely contribution to BRAO OS - monitor  Ophthalmic Meds Ordered this visit:  No orders of the defined types were placed in this encounter.    Return for f/u 3-4 weeks, BRAO OS, DFE, OCT.  There are no Patient Instructions on file for this visit.  Explained the diagnoses, plan, and follow up with the patient and they expressed  understanding.  Patient expressed understanding of the importance of proper follow up care.   Karie Chimera, M.D., Ph.D. Diseases & Surgery of the Retina and Vitreous [facility] 11/16/2022  I have reviewed the above documentation for accuracy and completeness, and I agree with the above. Karie Chimera, M.D., Ph.D. 11/16/22 10:46 PM   Abbreviations: M myopia (nearsighted); A astigmatism; H hyperopia (farsighted); P presbyopia; Mrx spectacle prescription;  CTL contact lenses; OD right eye; OS left eye; OU both eyes  XT exotropia; ET esotropia; PEK punctate epithelial keratitis; PEE punctate epithelial erosions; DES dry eye syndrome; MGD meibomian gland dysfunction; ATs artificial tears; PFAT's preservative free artificial tears; NSC nuclear sclerotic cataract; PSC posterior subcapsular cataract; ERM epi-retinal membrane; PVD posterior vitreous detachment; RD retinal detachment; DM diabetes mellitus; DR diabetic retinopathy; NPDR non-proliferative diabetic retinopathy; PDR proliferative diabetic retinopathy; CSME clinically significant macular edema; DME diabetic macular edema; dbh dot blot hemorrhages; CWS cotton wool spot; POAG primary open angle glaucoma; C/D cup-to-disc ratio; HVF humphrey visual field; GVF goldmann visual field; OCT optical coherence tomography; IOP intraocular pressure; BRVO Branch retinal vein occlusion; CRVO central retinal vein occlusion; CRAO central retinal artery occlusion; BRAO branch retinal artery occlusion; RT retinal tear; SB scleral buckle; PPV pars plana vitrectomy; VH Vitreous hemorrhage; PRP panretinal laser photocoagulation; IVK intravitreal kenalog; VMT vitreomacular traction; MH  Macular hole;  NVD neovascularization of the disc; NVE neovascularization elsewhere; AREDS age related eye disease study; ARMD age related macular degeneration; POAG primary open angle glaucoma; EBMD epithelial/anterior basement membrane dystrophy; ACIOL anterior  chamber intraocular lens; IOL intraocular lens; PCIOL posterior chamber intraocular lens; Phaco/IOL phacoemulsification with intraocular lens placement; PRK photorefractive keratectomy; LASIK laser assisted in situ keratomileusis; HTN hypertension; DM diabetes mellitus; COPD chronic obstructive pulmonary disease

## 2022-11-22 ENCOUNTER — Encounter: Payer: Medicaid Other | Attending: Family Medicine | Admitting: Nutrition

## 2022-11-22 NOTE — Progress Notes (Unsigned)
Patient is here today because he wants to know about CGM.  Says he is tired of testing his blood sugars, Discussed what CGM is and the difference between CGM and Blood sugar readings.  He was shown how to use the [facility] G7 sensor and given a sample to use.  Lot #   [id number] Exp: 2/25.  This was placed on his left arm and readings are going to his phone.  After warm up time, blood sugar was 141 and 3PM, and patient was very surprised that the reading was this low.  He reports that he has not tested his blood sugar "in a long time".  We discussed targets readings before and 2 hours pc meals.  We also discussed the arrow that accompanies the readings and what they mean, and when a finger stick will be required.  He reports not having a meter at this time.  He was given a Accu-chek Guide meter with 10 test strips and shown how to use this.   We also discussed that just seeing readings is not enough.  The object of the sensors is to determine the pattern of blood sugar readings over several days, to see if the insulin doses taken are working to bring the blood sugars down. Current insulin dose:  Humalog ac:  Says he was counting carbs and is 1u/10grams, but not doing this now.  Admits not taking any Humalog acB or acLonly 1/2 the time.                                                                  Supper is variable based on blood sugar reading and quantities eaten.  No sliding scale for high readings, just "takes extra".                                     Lantus: 32u HS.  Says takes this every night     We reviewed what carbs are and the fact that the more carbs you eat, the more Humalog you need. He was shown the CeQur device that you wear on your abdomen that holds 4 days of Humalog insulin, and you press a button on the device to deliver a 2 unit bolus.  Told him that if he wants this, I can give him a free sample for 8 days to try, but that he would need a prescription sent by his MD before being able  to try this.     He reports that his blood sugars are low on waking ever morning after taking 32u of Lantus insulin at 11PM.  He does not know exactly what the reading is, but feeling symptoms of lows:  weak and shaky.   Diet: Typical day:  patient reports that he does not have a typical day:  wakes ups at different times in the morning and eats whenever he feels hungry.  Very vague about what he is eating but says never take Humalog before his first meal.  11AM-12PM;  first meal  1 carton"oodles of noodles".  This is quick and easy for him to eat.  Takes no insulin for this- because he feels low.   2-4PM: Also eats  1 oodles of noodles.  Says blood sugars are usually high so sometimes will take his Humalog  8PM-10PM:  vague  says eats some kind of protein-but would not be specific and quantity of this is also vague.  Says has vegetables both starchy and non starchy.    Discussion: Humalog is taken to prevent the blood sugar from rising after a meal, not to bring in down.  Therefore, he should take this before all meals, if eating carbohydrates Lantus-timing, and this is the insulin that should bring blood sugars down in the AM.  If low, his dose is too high, if high, dose is not enough  Protocol for adjusting Lantus dose discussed:  Wait 3-5 days, if average FBS is less than 80, decrease dose by 2u, wait 3 days, repeat as needed.  If FBS is over 150, increase dose by 2u and wait 3-5 days before increasing again by 2u.  Do this until FBS is between 80-150 most days Humalog dose adjustment protocol:  Once FBS is in target range, if blood sugar is high acL, increase or take 1-2u more Humalog acB.  Once blood sugar is in target acL, test acS.  If high, increase Humalog acL by above range.  If high at HS, follow above scale.   High blood sugar adjustments for Humalog:  current blood sugar reading minus 150, divided by 60 equals extra amount of humalog needed to get blood sugar down. Encouraged carb counting  for more accurate insulin dose Encouraged a more balance meal of protein at every meal to prevent blood sugars from dropping before next meal, and small amount of fat to slow blood sugar rise.  Discussion of what foods fall into those groups. Discussed insulin pump therapy and what is needed to wear a pump.  He was given brochures of the 4 models of insulin pumps.  We discussed advantages and disadvantages of pump therapy.   Believe overall understanding is good, but motivation for needed insulin dosing is questionable.  AM hoping he will see how high his blood sugar goes from not taking his Humalog and/or eating sodas, or high fat foods.  Will contact him in one week to see what his blood sugar readings were doing. He had no final questions

## 2022-11-23 ENCOUNTER — Ambulatory Visit (INDEPENDENT_AMBULATORY_CARE_PROVIDER_SITE_OTHER): Payer: Medicaid Other | Admitting: Family Medicine

## 2022-11-23 ENCOUNTER — Encounter (HOSPITAL_BASED_OUTPATIENT_CLINIC_OR_DEPARTMENT_OTHER): Payer: Self-pay | Admitting: Family Medicine

## 2022-11-23 VITALS — BP 151/100 | HR 77 | Ht 68.0 in | Wt 127.0 lb

## 2022-11-23 DIAGNOSIS — R030 Elevated blood-pressure reading, without diagnosis of hypertension: Secondary | ICD-10-CM | POA: Insufficient documentation

## 2022-11-23 DIAGNOSIS — E1065 Type 1 diabetes mellitus with hyperglycemia: Secondary | ICD-10-CM | POA: Diagnosis not present

## 2022-11-23 MED ORDER — ACCU-CHEK GUIDE VI STRP: 1.0000 | ORAL_STRIP | 12 refills | Status: DC

## 2022-11-23 NOTE — Patient Instructions (Addendum)
Read over information given on CeQur, and insulin pumps.  Discuss this with your doctor if interested 2.   Record FBSs and for 3-5 days. If blood sugar average is over 150, increase dose Lantus by 2u.  If average is below 80, reduce dose by 2u.  Goal is a fasting blood sugar between 90-140 at least 80% of the time.   3.   Once FBSs are within goal, check blood sugar before 2nd meal, if over 150, take Humalog with first meal, starting at 2u, and increasing dose by 1u q 3-5 days until blood sugars before second meal are less than 150.   If blood sugar readings are high before supper.  Start taking 2u ac second meal, and increase that dose by 1u q 3-4 days until blood sugar readings before supper is less than 150.   If bedtime reading is then over 150 most days, increase dose of Humalog by 1u until bedtime reading is below 170.  This reading needs be 2 hours after eating supper.

## 2022-11-23 NOTE — Assessment & Plan Note (Signed)
Has had some elevations at prior office visits as well.  Not checking blood pressure at home regularly.  Given underlying diabetes as well as ophthalmic issues, maintaining appropriate blood pressure will be important moving forward.  Discussed this with patient. Recommend intermittent monitoring of blood pressure at home, DASH diet Monitor closely at follow-up office visits and consider initiation of antihypertensive should blood pressure continue to be elevated

## 2022-11-23 NOTE — Assessment & Plan Note (Signed)
At last appointment about 1 week ago, patient was referred to ophthalmology, nutrition, endocrinology.  He had evaluation with ophthalmology as below.  He also had appointment with nutritionist.  Nutritionist reviewed current medications, lifestyle modification recommendations.  He was set up with CGM device sample, has been doing well with this, started just yesterday.  Indicates that blood sugars on device have been ranging between 150-200.  He did receive instruction on proper administration of insulin, estimating carbohydrate meals and appropriate administration of sliding scale mealtime insulin. He was referred to endocrinology, however has not heard from the office.  It does appear that endocrinology referral has been processed, ready to be scheduled.  Patient provided with contact information for office while here in clinic today. Labs ordered previously, patient has not completed them as of yet.  He is not currently fasting.  Discussed options, we will proceed with labs today except for lipid panel which will need to be done as fasting lab in the future. Can continue with current insulin regimen, we will check hemoglobin A1c today for monitoring Recommend continued close follow-up with ophthalmology Complete foot exam at next office visit

## 2022-11-23 NOTE — Progress Notes (Signed)
    Procedures performed today:    None.  Independent interpretation of notes and tests performed by another provider:   None.  Brief History, Exam, Impression, and Recommendations:    BP (!) 151/100 (BP Location: Right Arm, Patient Position: Sitting, Cuff Size: Normal)   Pulse 77   Ht 5\' 8"  (1.727 m)   Wt 127 lb (57.6 kg)   SpO2 100%   BMI 19.31 kg/m   Poorly controlled type 1 diabetes mellitus (HCC) Assessment & Plan: At last appointment about 1 week ago, patient was referred to ophthalmology, nutrition, endocrinology.  He had evaluation with ophthalmology as below.  He also had appointment with nutritionist.  Nutritionist reviewed current medications, lifestyle modification recommendations.  He was set up with CGM device sample, has been doing well with this, started just yesterday.  Indicates that blood sugars on device have been ranging between 150-200.  He did receive instruction on proper administration of insulin, estimating carbohydrate meals and appropriate administration of sliding scale mealtime insulin. He was referred to endocrinology, however has not heard from the office.  It does appear that endocrinology referral has been processed, ready to be scheduled.  Patient provided with contact information for office while here in clinic today. Labs ordered previously, patient has not completed them as of yet.  He is not currently fasting.  Discussed options, we will proceed with labs today except for lipid panel which will need to be done as fasting lab in the future. Can continue with current insulin regimen, we will check hemoglobin A1c today for monitoring Recommend continued close follow-up with ophthalmology Complete foot exam at next office visit  Orders: -     Microalbumin / creatinine urine ratio -     Hemoglobin A1c -     CBC with Differential/Platelet -     Comprehensive metabolic panel -     Accu-Chek Guide; 1 each by Other route See admin instructions.   Dispense: 100 strip; Refill: 12 -     Ambulatory referral to Cardiology  Elevated blood pressure reading in office without diagnosis of hypertension Assessment & Plan: Has had some elevations at prior office visits as well.  Not checking blood pressure at home regularly.  Given underlying diabetes as well as ophthalmic issues, maintaining appropriate blood pressure will be important moving forward.  Discussed this with patient. Recommend intermittent monitoring of blood pressure at home, DASH diet Monitor closely at follow-up office visits and consider initiation of antihypertensive should blood pressure continue to be elevated   Patient had evaluation with ophthalmology for recently reported complaint of vision loss.  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  No retinal or ophthalmic interventions indicated at that time, however ophthalmologist recommended optimizing cardiovascular risk factors.  Additionally, recommended for patient to have cardiac evaluation to further evaluate/exclude additional risk factors for BRAO.  Ophthalmologist recommended follow-up in about 3 to 4 weeks  Return in about 3 weeks (around 12/14/2022) for diabetes.  Spent 35 minutes on this patient encounter, including preparation, chart review, face-to-face counseling with patient and coordination of care, and documentation of encounter   ___________________________________________ Kion Huntsberry de Peru, MD, ABFM, [facility] Primary Care and Sports Medicine [facility]

## 2022-11-24 LAB — CBC WITH DIFFERENTIAL/PLATELET
Basophils Absolute: 0 10*3/uL (ref 0.0–0.2)
Basos: 0 %
EOS (ABSOLUTE): 0.1 10*3/uL (ref 0.0–0.4)
Eos: 1 %
Hematocrit: 45.9 % (ref 37.5–51.0)
Hemoglobin: 15.6 g/dL (ref 13.0–17.7)
Immature Grans (Abs): 0 10*3/uL (ref 0.0–0.1)
Lymphocytes Absolute: 2.4 10*3/uL (ref 0.7–3.1)
Lymphs: 34 %
MCHC: 34 g/dL (ref 31.5–35.7)
Monocytes Absolute: 0.6 10*3/uL (ref 0.1–0.9)
Neutrophils Absolute: 3.8 10*3/uL (ref 1.4–7.0)
Neutrophils: 56 %
Platelets: 312 10*3/uL (ref 150–450)
RBC: 5.33 x10E6/uL (ref 4.14–5.80)
RDW: 12.3 % (ref 11.6–15.4)

## 2022-11-24 LAB — COMPREHENSIVE METABOLIC PANEL
ALT: 17 IU/L (ref 0–44)
AST: 15 IU/L (ref 0–40)
Albumin: 4.6 g/dL (ref 4.3–5.2)
Alkaline Phosphatase: 75 IU/L (ref 44–121)
BUN: 15 mg/dL (ref 6–20)
Bilirubin Total: 0.5 mg/dL (ref 0.0–1.2)
CO2: 27 mmol/L (ref 20–29)
Calcium: 10.4 mg/dL — ABNORMAL HIGH (ref 8.7–10.2)
Chloride: 99 mmol/L (ref 96–106)
Creatinine, Ser: 0.78 mg/dL (ref 0.76–1.27)
Globulin, Total: 2.5 g/dL (ref 1.5–4.5)
Potassium: 4.7 mmol/L (ref 3.5–5.2)
Sodium: 139 mmol/L (ref 134–144)
Total Protein: 7.1 g/dL (ref 6.0–8.5)
eGFR: 128 mL/min/{1.73_m2} (ref >59)

## 2022-11-24 LAB — MICROALBUMIN / CREATININE URINE RATIO
Microalb/Creat Ratio: 31 mg/g creat — ABNORMAL HIGH (ref 0–29)
Microalbumin, Urine: 36.9 ug/mL

## 2022-11-24 LAB — HEMOGLOBIN A1C: Hgb A1c MFr Bld: 9.1 % — ABNORMAL HIGH (ref 4.8–5.6)

## 2022-12-05 ENCOUNTER — Encounter (INDEPENDENT_AMBULATORY_CARE_PROVIDER_SITE_OTHER): Payer: Medicaid Other | Admitting: Ophthalmology

## 2022-12-05 DIAGNOSIS — I10 Essential (primary) hypertension: Secondary | ICD-10-CM

## 2022-12-05 DIAGNOSIS — H35033 Hypertensive retinopathy, bilateral: Secondary | ICD-10-CM

## 2022-12-05 DIAGNOSIS — H34232 Retinal artery branch occlusion, left eye: Secondary | ICD-10-CM

## 2022-12-05 DIAGNOSIS — E113391 Type 2 diabetes mellitus with moderate nonproliferative diabetic retinopathy without macular edema, right eye: Secondary | ICD-10-CM

## 2022-12-07 ENCOUNTER — Encounter (INDEPENDENT_AMBULATORY_CARE_PROVIDER_SITE_OTHER): Payer: Medicaid Other | Admitting: Ophthalmology

## 2022-12-07 ENCOUNTER — Encounter (INDEPENDENT_AMBULATORY_CARE_PROVIDER_SITE_OTHER): Payer: Self-pay

## 2022-12-07 DIAGNOSIS — H35033 Hypertensive retinopathy, bilateral: Secondary | ICD-10-CM

## 2022-12-07 DIAGNOSIS — E113391 Type 2 diabetes mellitus with moderate nonproliferative diabetic retinopathy without macular edema, right eye: Secondary | ICD-10-CM

## 2022-12-07 DIAGNOSIS — H34232 Retinal artery branch occlusion, left eye: Secondary | ICD-10-CM

## 2022-12-07 DIAGNOSIS — I10 Essential (primary) hypertension: Secondary | ICD-10-CM

## 2022-12-14 ENCOUNTER — Ambulatory Visit (HOSPITAL_BASED_OUTPATIENT_CLINIC_OR_DEPARTMENT_OTHER): Payer: Medicaid Other | Admitting: Family Medicine

## 2023-02-15 ENCOUNTER — Ambulatory Visit (HOSPITAL_BASED_OUTPATIENT_CLINIC_OR_DEPARTMENT_OTHER): Payer: Medicaid Other | Admitting: Cardiology

## 2023-02-20 ENCOUNTER — Other Ambulatory Visit (HOSPITAL_BASED_OUTPATIENT_CLINIC_OR_DEPARTMENT_OTHER): Payer: Self-pay | Admitting: Family Medicine

## 2023-02-20 DIAGNOSIS — E1065 Type 1 diabetes mellitus with hyperglycemia: Secondary | ICD-10-CM

## 2023-02-20 NOTE — Telephone Encounter (Signed)
Attempted to reach patient regarding overdue follow up, Dad answered will have patient call back.

## 2023-03-03 ENCOUNTER — Encounter (HOSPITAL_BASED_OUTPATIENT_CLINIC_OR_DEPARTMENT_OTHER): Payer: Self-pay

## 2023-04-05 ENCOUNTER — Telehealth: Payer: Self-pay

## 2023-04-05 ENCOUNTER — Other Ambulatory Visit (HOSPITAL_BASED_OUTPATIENT_CLINIC_OR_DEPARTMENT_OTHER): Payer: Self-pay | Admitting: *Deleted

## 2023-04-05 DIAGNOSIS — E1065 Type 1 diabetes mellitus with hyperglycemia: Secondary | ICD-10-CM

## 2023-04-05 MED ORDER — LANTUS SOLOSTAR 100 UNIT/ML ~~LOC~~ SOPN: 32.0000 [IU] | PEN_INJECTOR | Freq: Every day | SUBCUTANEOUS | 0 refills | Status: DC

## 2023-04-05 MED ORDER — BD PEN NEEDLE NANO U/F 32G X 4 MM MISC: 12 refills | Status: DC

## 2023-04-05 NOTE — Telephone Encounter (Signed)
Called pt he did not need to schedule an appointment. He needed refills on lantus and needles. Medication refills sent to pharmacy

## 2023-04-05 NOTE — Telephone Encounter (Signed)
Pt contacted wrong office for appt per E2C2 incorrect forwarding.

## 2023-04-11 ENCOUNTER — Telehealth: Payer: Self-pay

## 2023-04-11 NOTE — Telephone Encounter (Unsigned)
Copied from CRM [id number]. Topic: Clinical - Medication Refill >> Apr 04, 2023 12:49 PM Hector Shade B wrote: Most Recent Primary Care Visit:  Provider: DE Peru, [name]  Department: [facility]  Visit Type: FOLLOW UP  Date: 11/23/2022  Medication:  insulin glargine (LANTUS SOLOSTAR) 100 UNIT/ML Solostar Pen  Has the patient contacted their pharmacy? Yes they told the patient to call the providers office send in a refill  (Agent: If no, request that the patient contact the pharmacy for the refill. If patient does not wish to contact the pharmacy document the reason why and proceed with request.) (Agent: If yes, when and what did the pharmacy advise?)  Is this the correct pharmacy for this prescription? Yes If no, delete pharmacy and type the correct one.  This is the patient's preferred pharmacy: Yes   [id number] [place], [place] - [address] AT [facility] [address] [place] [address] Phone: [phone] Fax: [phone]   Has the prescription been filled recently? Yes  Is the patient out of the medication? Yes  Has the patient been seen for an appointment in the last year OR does the patient have an upcoming appointment? Yes  Can we respond through MyChart? No  Agent: Please be advised that Rx refills may take up to 3 business days. We ask that you follow-up with your pharmacy.

## 2023-04-11 NOTE — Telephone Encounter (Signed)
Called pt father advised he was not at home but would have patient call the office when he got home

## 2023-04-12 NOTE — Telephone Encounter (Signed)
Appointment made for 12/19

## 2023-04-20 ENCOUNTER — Other Ambulatory Visit (HOSPITAL_BASED_OUTPATIENT_CLINIC_OR_DEPARTMENT_OTHER): Payer: Self-pay | Admitting: Family Medicine

## 2023-04-20 ENCOUNTER — Encounter (HOSPITAL_BASED_OUTPATIENT_CLINIC_OR_DEPARTMENT_OTHER): Payer: Self-pay | Admitting: Family Medicine

## 2023-04-20 ENCOUNTER — Ambulatory Visit (INDEPENDENT_AMBULATORY_CARE_PROVIDER_SITE_OTHER): Payer: Medicaid Other | Admitting: Family Medicine

## 2023-04-20 VITALS — BP 133/86 | HR 81 | Ht 67.0 in | Wt 130.3 lb

## 2023-04-20 DIAGNOSIS — E1065 Type 1 diabetes mellitus with hyperglycemia: Secondary | ICD-10-CM | POA: Diagnosis not present

## 2023-04-20 LAB — HEMOGLOBIN A1C
Est. average glucose Bld gHb Est-mCnc: 232 mg/dL
Hgb A1c MFr Bld: 9.7 % — ABNORMAL HIGH (ref 4.8–5.6)

## 2023-04-20 MED ORDER — ACCU-CHEK GUIDE TEST VI STRP: ORAL_STRIP | 12 refills | Status: DC

## 2023-04-20 NOTE — Progress Notes (Signed)
    Procedures performed today:    None.  Independent interpretation of notes and tests performed by another provider:   None.  Brief History, Exam, Impression, and Recommendations:    BP 133/86 (BP Location: Right Arm, Patient Position: Sitting, Cuff Size: Normal)   Pulse 81   Ht 5\' 7"  (1.702 m)   Wt 130 lb 4.8 oz (59.1 kg)   SpO2 100%   BMI 20.41 kg/m   Poorly controlled type 1 diabetes mellitus (HCC) Assessment & Plan: Patient last seen in the office about 5 months ago, at that time was advised on following up in 3 weeks, unfortunately never did so.  He does continue with insulin and reports that he has been checking his blood sugars regularly, reports that fasting blood sugar has been about 150-200.  Daytime readings have been similar with readings ranging from 150-250. He previously has been referred to endocrinology, has not established with them as of yet, no specific reason as to why he has not yet.  On review of prior referral, it does appear that referred to office did attempt to call patient 3 times without answer. He last saw ophthalmology in July for vision changes, recommendation for them was to follow-up in 3 weeks, however patient never did so, he does state that he is primarily citing financial reasons as a result for this. He is due for recheck of hemoglobin A1c which has not been at goal.  We will check today. 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  Has not been checking at home.  We will continue close monitoring in the office, recommend intermittent monitoring of blood pressure at home, DASH  diet.  Return in about 8 weeks (around 06/15/2023) for diabetes.   ___________________________________________ Jackie Littlejohn de Peru, MD, ABFM, [facility] Primary Care and Sports Medicine [facility]

## 2023-04-20 NOTE — Assessment & Plan Note (Signed)
Patient last seen in the office about 5 months ago, at that time was advised on following up in 3 weeks, unfortunately never did so.  He does continue with insulin and reports that he has been checking his blood sugars regularly, reports that fasting blood sugar has been about 150-200.  Daytime readings have been similar with readings ranging from 150-250. He previously has been referred to endocrinology, has not established with them as of yet, no specific reason as to why he has not yet.  On review of prior referral, it does appear that referred to office did attempt to call patient 3 times without answer. He last saw ophthalmology in July for vision changes, recommendation for them was to follow-up in 3 weeks, however patient never did so, he does state that he is primarily citing financial reasons as a result for this. He is due for recheck of hemoglobin A1c which has not been at goal.  We will check today. 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

## 2023-04-20 NOTE — Patient Instructions (Signed)
  Medication Instructions:  Your physician recommends that you continue on your current medications as directed. Please refer to the Current Medication list given to you today. --If you need a refill on any your medications before your next appointment, please call your pharmacy first. If no refills are authorized on file call the office.-- Lab Work: Your physician has recommended that you have lab work today: today If you have labs (blood work) drawn today and your tests are completely normal, you will receive your results via MyChart message OR a phone call from our staff.  Please ensure you check your voicemail in the event that you authorized detailed messages to be left on a delegated number. If you have any lab test that is abnormal or we need to change your treatment, we will call you to review the results.    Follow-Up: Your next appointment:   Your physician recommends that you schedule a follow-up appointment in: 2 month follow up  with Dr. de Peru  You will receive a text message or e-mail with a link to a survey about your care and experience with [place] today! We would greatly appreciate your feedback!   Thanks for letting us be apart of your health journey!!  Primary Care and Sports Medicine   Dr. Ceasar Mons Peru   We encourage you to activate your patient portal called "MyChart".  Sign up information is provided on this After Visit Summary.  MyChart is used to connect with patients for Virtual Visits (Telemedicine).  Patients are able to view lab/test results, encounter notes, upcoming appointments, etc.  Non-urgent messages can be sent to your provider as well. To learn more about what you can do with MyChart, please visit --  [email or web address].

## 2023-06-07 ENCOUNTER — Other Ambulatory Visit (HOSPITAL_BASED_OUTPATIENT_CLINIC_OR_DEPARTMENT_OTHER): Payer: Self-pay | Admitting: Family Medicine

## 2023-06-07 DIAGNOSIS — E1065 Type 1 diabetes mellitus with hyperglycemia: Secondary | ICD-10-CM

## 2023-06-07 MED ORDER — LANTUS SOLOSTAR 100 UNIT/ML ~~LOC~~ SOPN: 32.0000 [IU] | PEN_INJECTOR | Freq: Every day | SUBCUTANEOUS | 0 refills | Status: DC

## 2023-06-07 NOTE — Telephone Encounter (Signed)
 Last Fill: 04/05/23  Last OV: 04/20/23 Next OV: 06/15/23  Routing to provider for review/authorization.

## 2023-06-07 NOTE — Telephone Encounter (Signed)
 Copied from CRM [id number]. Topic: Clinical - Medication Refill >> Jun 07, 2023 10:29 AM Ivette P wrote: Most Recent Primary Care Visit:  Provider: [name], [name]  Department: [facility]  Visit Type: OFFICE VISIT  Date: 04/20/2023  Medication: insulin  glargine (LANTUS  SOLOSTAR) 100 UNIT/ML Solostar Pen  Has the patient contacted their pharmacy? Yes (Agent: If no, request that the patient contact the pharmacy for the refill. If patient does not wish to contact the pharmacy document the reason why and proceed with request.) (Agent: If yes, when and what did the pharmacy advise?)  Is this the correct pharmacy for this prescription? Yes If no, delete pharmacy and type the correct one.  This is the patient's preferred pharmacy:  [id number] [place], [place] - [address] AT [facility] [address] [place] [address] Phone: [phone] Fax: [phone]   Has the prescription been filled recently? No  Is the patient out of the medication? Yes, only has enough for tonight  Has the patient been seen for an appointment in the last year OR does the patient have an upcoming appointment? Yes  Can we respond through MyChart? No  Agent: Please be advised that Rx refills may take up to 3 business days. We ask that you follow-up with your pharmacy.

## 2023-06-08 ENCOUNTER — Encounter (HOSPITAL_BASED_OUTPATIENT_CLINIC_OR_DEPARTMENT_OTHER): Payer: Self-pay | Admitting: Family Medicine

## 2023-06-15 ENCOUNTER — Ambulatory Visit (INDEPENDENT_AMBULATORY_CARE_PROVIDER_SITE_OTHER): Payer: Medicaid Other | Admitting: Family Medicine

## 2023-06-15 ENCOUNTER — Encounter (HOSPITAL_BASED_OUTPATIENT_CLINIC_OR_DEPARTMENT_OTHER): Payer: Self-pay | Admitting: Family Medicine

## 2023-06-15 DIAGNOSIS — E1065 Type 1 diabetes mellitus with hyperglycemia: Secondary | ICD-10-CM

## 2023-06-15 MED ORDER — LANTUS SOLOSTAR 100 UNIT/ML ~~LOC~~ SOPN: 32.0000 [IU] | PEN_INJECTOR | Freq: Every day | SUBCUTANEOUS | 3 refills | Status: DC

## 2023-06-15 MED ORDER — BD PEN NEEDLE NANO U/F 32G X 4 MM MISC: 12 refills | Status: AC

## 2023-06-15 NOTE — Patient Instructions (Signed)
?  Medication Instructions:  ?Your physician recommends that you continue on your current medications as directed. Please refer to the Current Medication list given to you today. ?--If you need a refill on any your medications before your next appointment, please call your pharmacy first. If no refills are authorized on file call the office.-- ? ? ? ?Follow-Up: ?Your next appointment:   ?Your physician recommends that you schedule a follow-up appointment in: 3 month follow-up with Dr. de Peru ? ?You will receive a text message or e-mail with a link to a survey about your care and experience with [place] today! We would greatly appreciate your feedback!  ? ?Thanks for letting us be apart of your health journey!!  ?Primary Care and Sports Medicine  ? ?Dr. Marcy Salvo de Peru  ? ?We encourage you to activate your patient portal called "MyChart".  Sign up information is provided on this After Visit Summary.  MyChart is used to connect with patients for Virtual Visits (Telemedicine).  Patients are able to view lab/test results, encounter notes, upcoming appointments, etc.  Non-urgent messages can be sent to your provider as well. To learn more about what you can do with MyChart, please visit --  [email or web address].    ?

## 2023-06-15 NOTE — Assessment & Plan Note (Signed)
Patient last seen in the office about 2 months ago.  He does continue with insulin and reports that he has been checking his blood sugars regularly, reports that fasting blood sugar has been about 150-250. He previously has been referred to endocrinology, has not established with them as of yet, no specific reason as to why he has not yet.  On review of prior referral, it does appear that referred to office did attempt to call patient 3 times without answer. 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

## 2023-06-15 NOTE — Progress Notes (Signed)
    Procedures performed today:    None.  Independent interpretation of notes and tests performed by another provider:   None.  Brief History, Exam, Impression, and Recommendations:    BP (!) 150/94 (BP Location: Right Arm, Patient Position: Sitting, Cuff Size: Normal)   Pulse 84   Ht 5\' 7"  (1.702 m)   Wt 127 lb 9.6 oz (57.9 kg)   SpO2 99%   BMI 19.98 kg/m   Type 1 diabetes mellitus with hyperglycemia (HCC) -     Lantus SoloStar; Inject 32 Units into the skin at bedtime.  Dispense: 15 mL; Refill: 3 -     BD Pen Needle Nano U/F; USE TO INJECT INSULIN VIA INSULIN PEN 7 TIME A DAY  Dispense: 250 each; Refill: 12  Poorly controlled type 1 diabetes mellitus (HCC) Assessment & Plan: Patient last seen in the office about 2 months ago.  He does continue with insulin and reports that he has been checking his blood sugars regularly, reports that fasting blood sugar has been about 150-250. He previously has been referred to endocrinology, has not established with them as of yet, no specific reason as to why he has not yet.  On review of prior referral, it does appear that referred to office did attempt to call patient 3 times without answer. 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  Dispense: 15 mL; Refill: 3 -     BD Pen Needle Nano U/F; USE TO INJECT INSULIN VIA INSULIN PEN 7 TIME A DAY  Dispense: 250 each; Refill: 12  Return in about 3 months (around 09/12/2023) for  diabetes.   ___________________________________________ Maykayla Highley de [place], MD, ABFM, CAQSM Primary Care and Sports Medicine [facility])

## 2023-09-12 ENCOUNTER — Ambulatory Visit (HOSPITAL_BASED_OUTPATIENT_CLINIC_OR_DEPARTMENT_OTHER): Payer: Medicaid Other | Admitting: Family Medicine

## 2023-10-25 ENCOUNTER — Ambulatory Visit (HOSPITAL_BASED_OUTPATIENT_CLINIC_OR_DEPARTMENT_OTHER): Admitting: Family Medicine

## 2023-10-30 ENCOUNTER — Ambulatory Visit (HOSPITAL_BASED_OUTPATIENT_CLINIC_OR_DEPARTMENT_OTHER): Admitting: Family Medicine

## 2023-10-31 ENCOUNTER — Encounter (HOSPITAL_BASED_OUTPATIENT_CLINIC_OR_DEPARTMENT_OTHER): Payer: Self-pay | Admitting: Family Medicine

## 2023-10-31 ENCOUNTER — Ambulatory Visit (INDEPENDENT_AMBULATORY_CARE_PROVIDER_SITE_OTHER): Admitting: Family Medicine

## 2023-10-31 DIAGNOSIS — E1065 Type 1 diabetes mellitus with hyperglycemia: Secondary | ICD-10-CM | POA: Diagnosis not present

## 2023-10-31 LAB — POCT GLYCOSYLATED HEMOGLOBIN (HGB A1C)
HbA1c POC (<> result, manual entry): 11 % (ref 4.0–5.6)
Hemoglobin A1C: 11 % — AB (ref 4.0–5.6)

## 2023-10-31 MED ORDER — LANTUS SOLOSTAR 100 UNIT/ML ~~LOC~~ SOPN: 34.0000 [IU] | PEN_INJECTOR | Freq: Every day | SUBCUTANEOUS | 3 refills | Status: DC

## 2023-10-31 MED ORDER — ACCU-CHEK GUIDE TEST VI STRP: ORAL_STRIP | 12 refills | Status: AC

## 2023-10-31 NOTE — Progress Notes (Unsigned)
    Procedures performed today:    None.  Independent interpretation of notes and tests performed by another provider:   None.  Brief History, Exam, Impression, and Recommendations:    BP 124/86 (BP Location: Left Arm, Patient Position: Sitting, Cuff Size: Normal)   Pulse 84   Ht 5' 6 (1.676 m)   Wt 129 lb 11.2 oz (58.8 kg)   SpO2 100%   BMI 20.93 kg/m   There are no diagnoses linked to this encounter.No follow-ups on file.   ___________________________________________ Alwilda Gilland de [place], MD, ABFM, [facility] Primary Care and Sports Medicine [facility]

## 2023-10-31 NOTE — Patient Instructions (Signed)
 ?  Medication Instructions:  ?Your physician recommends that you continue on your current medications as directed. Please refer to the Current Medication list given to you today. ?--If you need a refill on any your medications before your next appointment, please call your pharmacy first. If no refills are authorized on file call the office.-- ? ? ? ?Follow-Up: ?Your next appointment:   ?Your physician recommends that you schedule a follow-up appointment in: 3 month follow-up with Dr. de [place] ? ?You will receive a text message or e-mail with a link to a survey about your care and experience with [place] today! We would greatly appreciate your feedback!  ? ?Thanks for letting us be apart of your health journey!!  ?Primary Care and Sports Medicine  ? ?Dr. Marcy Salvo de [place]  ? ?We encourage you to activate your patient portal called "MyChart".  Sign up information is provided on this After Visit Summary.  MyChart is used to connect with patients for Virtual Visits (Telemedicine).  Patients are able to view lab/test results, encounter notes, upcoming appointments, etc.  Non-urgent messages can be sent to your provider as well. To learn more about what you can do with MyChart, please visit --  [email or web address].    ?

## 2023-11-01 NOTE — Assessment & Plan Note (Signed)
 Patient last seen in the office about 4 months ago.  He does continue with insulin  and reports that he has been checking his blood sugars regularly, reports that fasting blood sugar has been about 150-230. He previously has been referred to endocrinology, reports having had prior visit, however no documentation on file. He last saw ophthalmology in 2024 for vision changes, recommendation for them was to follow-up in 3 weeks, patient indicates that he did return to their office, but was informed on copay and outstanding balance and was not able to pay this and thus left without being seen. Prior A1c above goal at 9.7% - checked in office today and elevated at 11.0% Foot exam completed previously, next due Dec 2025 Discussed recommendation for follow-up with ophthalmology for continued monitoring of acute on chronic BRAO Provided with contact information for endocrinology office Recommend increasing dose of long acting insulin  given A1c and elevated FBG readings. Ultimately, patient is at very high risk for complications from underlying diabetes given poor control of blood sugars, lack of recommend evaluation and follow-up with specialists. Complications include vision, neurologic, cardiovascular, renal complications with risks related to developing associated chronic medical issues, risks of amputations, premature death.

## 2024-01-27 ENCOUNTER — Other Ambulatory Visit (HOSPITAL_BASED_OUTPATIENT_CLINIC_OR_DEPARTMENT_OTHER): Payer: Self-pay | Admitting: Family Medicine

## 2024-01-27 DIAGNOSIS — E1065 Type 1 diabetes mellitus with hyperglycemia: Secondary | ICD-10-CM

## 2024-01-31 ENCOUNTER — Ambulatory Visit (HOSPITAL_BASED_OUTPATIENT_CLINIC_OR_DEPARTMENT_OTHER): Admitting: Family Medicine

## 2024-01-31 ENCOUNTER — Encounter (HOSPITAL_BASED_OUTPATIENT_CLINIC_OR_DEPARTMENT_OTHER): Payer: Self-pay | Admitting: *Deleted

## 2024-02-27 ENCOUNTER — Ambulatory Visit (HOSPITAL_COMMUNITY): Admission: EM | Admit: 2024-02-27 | Discharge: 2024-02-27 | Disposition: A | Discharge status: Home / Self Care

## 2024-02-27 ENCOUNTER — Encounter (HOSPITAL_COMMUNITY): Payer: Self-pay

## 2024-02-27 DIAGNOSIS — H5462 Unqualified visual loss, left eye, normal vision right eye: Secondary | ICD-10-CM | POA: Diagnosis not present

## 2024-02-27 DIAGNOSIS — H5712 Ocular pain, left eye: Secondary | ICD-10-CM

## 2024-02-27 DIAGNOSIS — H5704 Mydriasis: Secondary | ICD-10-CM

## 2024-02-27 NOTE — ED Provider Notes (Signed)
 [facility]    CSN: [id number] Arrival date & time: 02/27/24  1220      History   Chief Complaint No chief complaint on file.   HPI Rodney [name] is a 23 y.o. male.   Pt presents today due to 6 days worth of left eye pain. Pt states that he started with a stye of left eye and used warm compresses. Pt states that he stye resolved with a pop.  Patient states that initially his eye felt better soon he started to experience marked pain and blurred vision.  Visual acuity in office today is right: 20/40 left: Worse than 20/200.  Patient is experiencing left periorbital tenderness, excessive tearing, and sensitivity to light in left eye.  The history is provided by the patient.    Past Medical History:  Diagnosis Date   Abrasion of arm, right 08/22/2011   Bipolar 1 disorder (HCC)    Depression    Diabetes mellitus    Type I - insulin  pump   Nasal fracture 08/13/2011   was hit in nose while playing basketball   Seizures (HCC)    x 3 - due to low blood sugar - last time 5-6 yrs. 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   GERD (gastroesophageal reflux disease) 09/15/2015   Unintentional weight loss 09/15/2015   Closed Bennett's fracture of right thumb 08/04/2015   Injury of right hand 07/28/2015   Diabetic ketoacidosis without coma associated with type 1 diabetes mellitus (HCC)    Ketonuria 04/17/2014   Poorly controlled  type 1 diabetes mellitus (HCC) 04/17/2014   Hyperglycemia due to type 1 diabetes mellitus (HCC) 03/18/2014   Diabetic ketoacidosis (HCC) 05/20/2013   Dehydration 05/06/2013   Non compliance with medical treatment 05/06/2013   DKA (diabetic ketoacidosis) (HCC) 05/05/2013   Diabetes (HCC) 03/25/2013   Mild Diabetic ketoacidosis 03/25/2013   Nausea with vomiting 03/25/2013   Adjustment reaction 12/05/2012   Non compliance w medication regimen 11/21/2012   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HCC) 08/02/2012   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   Fracture, nasal 08/24/2011    Class: Acute   Goiter    Hypoglycemia associated with diabetes (HCC) 05/23/2011   Thyroiditis, 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/23/2011   Diabetic autonomic neuropathy (HCC) 05/23/2011   Tachycardia 05/23/2011   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 Authorizing Provider  Accu-Chek FastClix Lancets MISC  06/25/15   [provider]  acetaminophen  (TYLENOL ) 500 MG tablet  Take 1,000-1,500 mg by mouth every 6 (six) hours as needed for headache or mild pain.    [provider]  Blood Glucose Monitoring Suppl (ACCU-CHEK GUIDE ME) w/Device KIT USE UP TO FOUR TIMES DAILY AS DIRECTED. USE TO CHECK FASTING SUGAR AND 1 TIME IN THE PM. 10/13/21   [name], [name], MD  glucose blood (ACCU-CHEK GUIDE TEST) test strip Use as instructed 10/31/23   [name], [name], MD  ibuprofen  (ADVIL ) 200 MG tablet Take 400-600 mg by mouth  every 6 (six) hours as needed for headache or mild pain.    [provider]  insulin  aspart (NOVOLOG  FLEXPEN) 100 UNIT/ML FlexPen Sliding scale. Inject three times daily with meals 05/23/22   [name], [name], MD  insulin  glargine (LANTUS  SOLOSTAR) 100 UNIT/ML Solostar Pen INJECT 34 UNITS INTO THE SKIN AT BEDTIME. 01/29/24   [name], [name], MD  Insulin  Pen Needle (BD PEN NEEDLE NANO U/F) 32G X 4 MM MISC USE TO INJECT INSULIN  VIA INSULIN  PEN 7 TIME A DAY 06/15/23   [name], [name], MD  Insulin  Syringe-Needle U-100 31G X 5/16 1 ML MISC Use with insulin  vial 08/07/20   Hershal Ozell PARAS, MD    Family History Family History  Problem Relation Age of Onset   Heart disease Mother        MI at age 74   Diabetes Paternal Grandfather        type 2   Hypertension Paternal Grandfather    Heart disease Paternal Grandfather    Hepatitis C Brother        older brother (incarcerated)   Hypertension Father     Social History Social History   Tobacco Use   Smoking status: Every Day    Current packs/day: 2.00    Average packs/day: 2.0 packs/day for 7.8 years (15.6 ttl pk-yrs)    Types: Cigarettes    Start date: 2018    Passive exposure: Current   Smokeless tobacco: Never   Tobacco comments:    Currently smoking 1ppd as of 11/15/22 ep  Vaping Use   Vaping status: Never Used  Substance Use Topics   Alcohol use: Yes   Drug use: Yes    Types: Marijuana     Allergies   Patient has no known allergies.   Review of Systems Review of Systems   Physical Exam Triage Vital Signs ED Triage Vitals [02/27/24 1307]  Encounter Vitals Group     BP (!) 127/91     Girls Systolic BP Percentile      Girls Diastolic BP Percentile      Boys Systolic BP Percentile      Boys Diastolic BP Percentile      Pulse Rate 76     Resp 16     Temp (!) 97.4 F (36.3 C)     Temp Source Oral     SpO2 98 %     Weight      Height      Head Circumference      Peak Flow      Pain Score 6      Pain Loc      Pain Education      Exclude from Growth Chart    No data found.  Updated Vital Signs BP (!) 127/91 (BP Location: Left Arm)   Pulse 76   Temp (!) 97.4 F (36.3 C) (Oral)   Resp 16   SpO2 98%   Visual Acuity  Right Eye Distance: 20/40 Left Eye Distance: 0 Bilateral Distance: 20/30  Right Eye Near:   Left Eye Near:    Bilateral Near:     Physical Exam Eyes:     General: Lids are normal. Lids are everted, no foreign bodies appreciated.     Extraocular Movements: Extraocular movements intact.     Conjunctiva/sclera:     Left eye: Left conjunctiva is injected.     Pupils:     Right eye: Pupil is round and reactive.     Left eye: Pupil is not reactive. Pupil is round.     Comments: Injected and erythematous bulbar and palpebral conjunctiva of left eye, left pupil is dilated and not reactive to light, tenderness to palpation of periorbital area around left eye      UC Treatments / Results  Labs (all labs ordered are listed, but only abnormal results are displayed) Labs Reviewed - No data to display  EKG   Radiology No results found.  Procedures Procedures (including critical care time)  Medications Ordered in UC Medications - No data to display  Initial Impression / Assessment and Plan / UC Course  I have reviewed the triage vital signs and the nursing notes.  Pertinent labs & imaging results that were available during my care of the patient were reviewed by me and considered in my medical decision making (see chart for details).     Ophthalmologist was contacted at [name].  Patient has an appointment to be seen at 1445 today. Final Clinical Impressions(s) / UC Diagnoses   Final diagnoses:  Periorbital pain, left  Vision loss, left eye  Fixed dilated pupil of left eye     Discharge Instructions      Please report to [facility] at [facility]. [address] at 2: 45 PM today for further  evaluation     ED Prescriptions   None    PDMP not reviewed this encounter.   Andra Corean BROCKS, PA-C 02/27/24 1351

## 2024-02-27 NOTE — ED Triage Notes (Signed)
 Patient states that he had a left eye stye 5-6 days ago and began putting warm compresses on the eye. Patient stated, I felt something pop and now I have pain all around my eye and yellow eye drainage.  Patient states he was using eye drops for a Stye .

## 2024-02-27 NOTE — Discharge Instructions (Signed)
 Please report to [facility] at [facility]. [address] at 2: 45 PM today for further evaluation

## 2024-02-28 ENCOUNTER — Encounter (INDEPENDENT_AMBULATORY_CARE_PROVIDER_SITE_OTHER): Payer: Self-pay | Admitting: Ophthalmology

## 2024-02-28 ENCOUNTER — Ambulatory Visit (INDEPENDENT_AMBULATORY_CARE_PROVIDER_SITE_OTHER): Admitting: Ophthalmology

## 2024-02-28 DIAGNOSIS — Z794 Long term (current) use of insulin: Secondary | ICD-10-CM | POA: Diagnosis not present

## 2024-02-28 DIAGNOSIS — H34232 Retinal artery branch occlusion, left eye: Secondary | ICD-10-CM

## 2024-02-28 DIAGNOSIS — E113391 Type 2 diabetes mellitus with moderate nonproliferative diabetic retinopathy without macular edema, right eye: Secondary | ICD-10-CM | POA: Diagnosis not present

## 2024-02-28 DIAGNOSIS — I10 Essential (primary) hypertension: Secondary | ICD-10-CM | POA: Diagnosis not present

## 2024-02-28 DIAGNOSIS — H4052X2 Glaucoma secondary to other eye disorders, left eye, moderate stage: Secondary | ICD-10-CM | POA: Diagnosis not present

## 2024-02-28 DIAGNOSIS — H35033 Hypertensive retinopathy, bilateral: Secondary | ICD-10-CM

## 2024-02-28 MED ORDER — BEVACIZUMAB CHEMO INJECTION 1.25MG/0.05ML SYRINGE FOR KALEIDOSCOPE
1.2500 mg | INTRAVITREAL | Status: AC | PRN
  Administered 2024-02-28: 1.25 mg via INTRAVITREAL

## 2024-02-28 NOTE — Progress Notes (Signed)
 [facility] - Clinic Note  02/28/2024   CHIEF COMPLAINT Patient presents for Retina Evaluation  HISTORY OF PRESENT ILLNESS: Rodney [name] is a 23 y.o. male who presents to the clinic today for:  HPI     Retina Evaluation   In both eyes.  This started 6 days ago.  Duration of 6 days.  Associated Symptoms Pain.  Context:  distance vision, mid-range vision and near vision.  I, the attending physician,  performed the HPI with the patient and updated documentation appropriately.        Comments   Retina eval per Dr Fleeta for BRAO OS NVG OU pt is reporting for the past 6 days he has been having eye pain in OS thought it was a stye he had noticed his vision was more blurred as he has had some flashes and floaters he has been photo phobic in OS  pt is diabetic he does not check blood sugar A1C 9.7 he is using 3 gtts unsure which ones       Last edited by Valdemar Rogue, MD on 02/28/2024 12:55 PM.    Pt states he presented to urgent care for eye pain in OS, was sent to Dr. Fleeta who was on call. Pt states after his appt w/ us  in July of last year and just didn't make an appointment. Does not have f/u scheduled w/ Dr. Fleeta, did not bring drops. Currently on Rhopressa, Vyzulta, Simbrinza -- started by Dr. Fleeta yesterday  Referring physician: Fleeta Zerita DASEN, MD [address] [place],  [place] [address]  HISTORICAL INFORMATION:  Selected notes from the MEDICAL RECORD NUMBER Referred by Dr. Lamar Gaudy for subacute vision loss OS, inferior visual field LEE:  Ocular Hx- PMH-   CURRENT MEDICATIONS: No current outpatient medications on file. (Ophthalmic Drugs)   No current facility-administered medications for this visit. (Ophthalmic Drugs)   Current Outpatient Medications (Other)  Medication Sig   Accu-Chek FastClix Lancets MISC    acetaminophen  (TYLENOL ) 500 MG tablet Take 1,000-1,500 mg by mouth every 6 (six) hours as needed for headache or mild pain.   Blood Glucose  Monitoring Suppl (ACCU-CHEK GUIDE ME) w/Device KIT USE UP TO FOUR TIMES DAILY AS DIRECTED. USE TO CHECK FASTING SUGAR AND 1 TIME IN THE PM.   glucose blood (ACCU-CHEK GUIDE TEST) test strip Use as instructed   ibuprofen  (ADVIL ) 200 MG tablet Take 400-600 mg by mouth every 6 (six) hours as needed for headache or mild pain.   insulin  aspart (NOVOLOG  FLEXPEN) 100 UNIT/ML FlexPen Sliding scale. Inject three times daily with meals   insulin  glargine (LANTUS  SOLOSTAR) 100 UNIT/ML Solostar Pen INJECT 34 UNITS INTO THE SKIN AT BEDTIME.   Insulin  Pen Needle (BD PEN NEEDLE NANO U/F) 32G X 4 MM MISC USE TO INJECT INSULIN  VIA INSULIN  PEN 7 TIME A DAY   Insulin  Syringe-Needle U-100 31G X 5/16 1 ML MISC Use with insulin  vial   No current facility-administered medications for this visit. (Other)   REVIEW OF SYSTEMS: ROS   Positive for: Endocrine, Eyes Last edited by Resa Delon ORN, COT on 02/28/2024  8:28 AM.     ALLERGIES No Known Allergies PAST MEDICAL HISTORY Past Medical History:  Diagnosis Date   Abrasion of arm, right 08/22/2011   Bipolar 1 disorder (HCC)    Depression    Diabetes mellitus    Type I - insulin  pump   Nasal fracture 08/13/2011   was hit in nose while playing  basketball   Seizures (HCC)    x 3 - due to low blood sugar - last time 5-6 yrs. ago   Past Surgical History:  Procedure Laterality Date   APPENDECTOMY     CLOSED REDUCTION NASAL FRACTURE  08/24/2011   Procedure: CLOSED REDUCTION NASAL FRACTURE;  Surgeon: Alm Bouche, MD;  Location: [facility];  Service: ENT;  Laterality: N/A;   FAMILY HISTORY Family History  Problem Relation Age of Onset   Heart disease Mother        MI at age 53   Diabetes Paternal Grandfather        type 2   Hypertension Paternal Grandfather    Heart disease Paternal Grandfather    Hepatitis C Brother        older brother (incarcerated)   Hypertension Father    SOCIAL HISTORY Social History   Tobacco Use    Smoking status: Every Day    Current packs/day: 2.00    Average packs/day: 2.0 packs/day for 7.8 years (15.6 ttl pk-yrs)    Types: Cigarettes    Start date: 2018    Passive exposure: Current   Smokeless tobacco: Never   Tobacco comments:    Currently smoking 1ppd as of 11/15/22 ep  Vaping Use   Vaping status: Never Used  Substance Use Topics   Alcohol use: Yes   Drug use: Yes    Types: Marijuana       OPHTHALMIC EXAM:  Base Eye Exam     Visual Acuity (Snellen - Linear)       Right Left   Dist [place] 20/30 20/250 -2   Dist ph [place] 20/20 -1 NI         Tonometry (Tonopen, 8:36 AM)       Right Left   Pressure 14 32  1 gtts brom /dorz os         Pupils       Pupils Dark Light Shape React APD   Right PERRL 4 3 Round Brisk None   Left PERRL 4 3 Round Brisk None         Visual Fields       Left Right    Full Full         Extraocular Movement       Right Left    Full, Ortho Full, Ortho         Neuro/Psych     Oriented x3: Yes   Mood/Affect: Normal         Dilation     Both eyes: 2.5% Phenylephrine @ 8:38 AM           Slit Lamp and Fundus Exam     Slit Lamp Exam       Right Left   Lids/Lashes Normal Normal   Conjunctiva/Sclera White and quiet 1+ injection   Cornea Clear Clear   Anterior Chamber deep and clear deep and clear, closed angles w/ NVI   Iris Round and dilated, No NVI Florid NVI, poor dilation   Lens Clear Clear   Anterior Vitreous Normal Normal         Fundus Exam       Right Left   Disc Pink and Sharp, no NVD Pink and Sharp, no NVD   C/D Ratio 0.2 0.4   Macula Flat, Good foveal reflex, rare MA Hazy view, grossly flat; atrophic, scattered MA / DBH   Vessels attenuated, copper wiring, mild tortuosity, no NV Attenuated, Tortuous   Periphery  Attached, scattered MA / DBH greatest posteriorly limited viewAttached           Refraction     Manifest Refraction   Unable to correct OS pt eye hurting when looking for  period of time           IMAGING AND PROCEDURES  Imaging and Procedures for 02/28/2024  OCT, Retina - OU - Both Eyes       Right Eye Quality was good. Central Foveal Thickness: 306. Progression has been stable. Findings include normal foveal contour, no IRF, no SRF.   Left Eye Quality was good. Central Foveal Thickness: 257. Progression has worsened. Findings include normal foveal contour, no IRF, no SRF, abnormal foveal contour, inner retinal atrophy (Focal inner retinal atrophy ST fovea and macula, old IRA IT macula seen best on widefield).   Notes *Images captured and stored on drive  Diagnosis / Impression:  OD: NFP, no IRF/SRF OS: Focal inner retinal atrophy ST fovea and macula, old IRA IT macula seen best on widefield--BRAO  Clinical management:  See below  Abbreviations: NFP - Normal foveal profile. CME - cystoid macular edema. PED - pigment epithelial detachment. IRF - intraretinal fluid. SRF - subretinal fluid. EZ - ellipsoid zone. ERM - epiretinal membrane. ORA - outer retinal atrophy. ORT - outer retinal tubulation. SRHM - subretinal hyper-reflective material. IRHM - intraretinal hyper-reflective material      Intravitreal Injection, Pharmacologic Agent - OS - Left Eye       Time Out 02/28/2024. 9:45 AM. Confirmed correct patient, procedure, site, and patient consented.   Anesthesia Topical anesthesia was used. Anesthetic medications included Lidocaine  2%, Proparacaine 0.5%.   Procedure Preparation included 5% betadine to ocular surface, eyelid speculum. A (32g) needle was used.   Injection: 1.25 mg Bevacizumab 1.25mg /0.33ml   Route: Intravitreal, Site: Left Eye   NDC: [id number], Lot: [id number], Expiration date: 06/06/2024   Post-op Post injection exam found visual acuity of at least counting fingers. The patient tolerated the procedure well. There were no complications. The patient received written and verbal post procedure care education. Post injection  medications were not given.   Notes An AC tap was performed following injection due to elevated IOP using a 30 gauge needle on a syringe with the plunger removed. The needle was placed at the limbus at 5 oclock and approximately 0.13cc of aqueous was removed from the anterior chamber. Betadine was applied to the tap area before and after the paracentesis was performed. There were no complications. The patient tolerated the procedure well. The IOP was rechecked and was found to be ~9 mmHg by digital palpation.           ASSESSMENT/PLAN:   ICD-10-CM   1. Neovascular glaucoma of left eye, moderate stage  H40.52X2 Intravitreal Injection, Pharmacologic Agent - OS - Left Eye    Bevacizumab (AVASTIN) SOLN 1.25 mg    2. 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 Current use of insulin  (HCC)  Z79.4     5. Essential hypertension  I10     6. Hypertensive retinopathy of both eyes  H35.033      1.  Neovascular Glaucoma (NVG) OS --  secondary to BRAO OS (see below)  **Pt lost to f/u from 07.17.24 to 10.29.25 (1.3 years)**  - pt presented to Urgent Care yesterday for progressive eye pain OS -- referred to Dr. Fleeta, who then referred back here for NVG OS  - pt was started on samples of Vyzulta, Rhopressa and Simbrinza by Dr. Fleeta  - Today, IOP OS 32  - exam shows florid NVI and closed angles (NVA)  - recommend IVA OS #1 w/ AC tap today for NVG - RBA of procedure discussed, questions answered - informed consent obtained and signed - see procedure note   - cont Vyzulta, Rhopressa and Simbrinza per Dr. Fleeta  - Begin Atropine BID OS-sample given in office  - f/u here in 4 wks -- follow up w/ Dr Fleeta on Friday as  scheduled  2. BRAO (branch retinal artery occlusion) OS  - **Pt lost to f/u 3-4weeks to 1.3 years; 07. 17.24 to 10.29.25**-referred back over from Dr. Fleeta for decrease vision OS. - pt reported superior visual field loss OS with onset Thursday, 07.11.24 - initial exam (2024) showed severe arteriolar attenuation and segment of retinal whitening superior macula - FA 07.17.24 shows vascular nonperfusion superior and temporal macula  - From July 2024 not completed by pt: -Recommend the following bloodwork:  complete blood count (CBC) with differential  prothrombin time/activated partial thromboplastin time (PT/PTT) consider lipid profile, antinuclear antibody (ANA), rheumatoid factor, syphilis testing (RPR or VDRL and FTA-ABS or treponemal-specific assay), serum protein electrophoresis, hemoglobin electrophoresis, and further evaluation for hypercoagulable state: antiphospholipid syndrome, factor V Leiden, activated protein C resistance, hyperhomocysteinemia, protein C and S deficiency, antithrombin III mutation, prothrombin mutation, etc. - Carotid artery evaluation by duplex Doppler US . - Cardiac evaluation with electrocardiography (ECG), echocardiography, and possibly Holter monitoring. - consider MRI brain for screening of subclinical emboli to brain - will discuss case with PCP to try to coordinate appropriate work-up =========================== - presents today with NVG (see above) - OCT shows Focal inner retinal atrophy ST fovea and macula, old IRA IT macula seen best on widefield -- old BRAO - Recommend IVA OS #1 today, 10.29.25 w/ f/u in 4 weeks for NVG - pt wishes to proceed w/ injection - RBA of procedure discussed, questions answered - informed consent obtained and signed on 10.29.25 - see procedure note  - f/u in 4 weeks, DFE, OCT  3,4. Moderate nonproliferative diabetic retinopathy w/o DME OU  - A1c: 11.0 on 07.01.25, 12.5 on 09.18.24 - The incidence, risk factors for  progression, natural history and treatment options for diabetic retinopathy were discussed with patient.   - The need for close monitoring of blood glucose, blood pressure, and serum lipids, avoiding cigarette or any type of tobacco, and the need for long term follow up was also discussed with patient. - exam shows scattered MA/DBH OU; no NV  - FA (07.17.24) shows no NV, clusters of leaking MA posteriorly about the disc - OCT without diabetic macular edema OU - f/u as above  5,6. Hypertensive retinopathy OU  - BP in 2024: RA: 146/95 LA: 147/101 - discussed importance of tight BP control and likely contribution to BRAO OS - monitor  Ophthalmic Meds Ordered this visit:  Meds ordered this encounter  Medications   Bevacizumab (AVASTIN) SOLN 1.25 mg     Return in about 4 weeks (around 03/27/2024) for BRAO w/ NVG OS, DFE, OCT, Possible Injxn.  There are no Patient Instructions on file for this visit.  Explained the diagnoses, plan, and  follow up with the patient and they expressed understanding.  Patient expressed understanding of the importance of proper follow up care.    This document serves as a record of services personally performed by Redell JUDITHANN Hans, MD, PhD. It was created on their behalf by Almetta Pesa, an ophthalmic technician. The creation of this record is the provider's dictation and/or activities during the visit.    Electronically signed by: Almetta Pesa, OA, 02/28/24  1:00 PM  Redell JUDITHANN Hans, M.D., Ph.D. Diseases & Surgery of the Retina and Vitreous [facility] 02/28/2024  I have reviewed the above documentation for accuracy and completeness, and I agree with the above. Redell JUDITHANN Hans, M.D., Ph.D. 02/28/24 1:11 PM   Abbreviations: M myopia (nearsighted); A astigmatism; H hyperopia (farsighted); P presbyopia; Mrx spectacle prescription;  CTL contact lenses; OD right eye; OS left eye; OU both eyes  XT exotropia; ET esotropia; PEK  punctate epithelial keratitis; PEE punctate epithelial erosions; DES dry eye syndrome; MGD meibomian gland dysfunction; ATs artificial tears; PFAT's preservative free artificial tears; NSC nuclear sclerotic cataract; PSC posterior subcapsular cataract; ERM epi-retinal membrane; PVD posterior vitreous detachment; RD retinal detachment; DM diabetes mellitus; DR diabetic retinopathy; NPDR non-proliferative diabetic retinopathy; PDR proliferative diabetic retinopathy; CSME clinically significant macular edema; DME diabetic macular edema; dbh dot blot hemorrhages; CWS cotton wool spot; POAG primary open angle glaucoma; C/D cup-to-disc ratio; HVF humphrey visual field; GVF goldmann visual field; OCT optical coherence tomography; IOP intraocular pressure; BRVO Branch retinal vein occlusion; CRVO central retinal vein occlusion; CRAO central retinal artery occlusion; BRAO branch retinal artery occlusion; RT retinal tear; SB scleral buckle; PPV pars plana vitrectomy; VH Vitreous hemorrhage; PRP panretinal laser photocoagulation; IVK intravitreal kenalog; VMT vitreomacular traction; MH Macular hole;  NVD neovascularization of the disc; NVE neovascularization elsewhere; AREDS age related eye disease study; ARMD age related macular degeneration; POAG primary open angle glaucoma; EBMD epithelial/anterior basement membrane dystrophy; ACIOL anterior chamber intraocular lens; IOL intraocular lens; PCIOL posterior chamber intraocular lens; Phaco/IOL phacoemulsification with intraocular lens placement; PRK photorefractive keratectomy; LASIK laser assisted in situ keratomileusis; HTN hypertension; DM diabetes mellitus; COPD chronic obstructive pulmonary disease

## 2024-03-15 NOTE — Progress Notes (Shared)
 [facility] - Clinic Note  03/27/2024   CHIEF COMPLAINT Patient presents for No chief complaint on file.  HISTORY OF PRESENT ILLNESS: Rodney [name] is a 23 y.o. male who presents to the clinic today for:   Pt states he presented to urgent care for eye pain in OS, was sent to Dr. Fleeta who was on call. Pt states after his appt w/ us  in July of last year and just didn't make an appointment. Does not have f/u scheduled w/ Dr. Fleeta, did not bring drops. Currently on Rhopressa, Vyzulta, Simbrinza -- started by Dr. Fleeta yesterday  Referring physician: [name], [name], MD [address] [place],  [place] [address]  HISTORICAL INFORMATION:  Selected notes from the MEDICAL RECORD NUMBER Referred by Dr. Lamar Gaudy for subacute vision loss OS, inferior visual field LEE:  Ocular Hx- PMH-   CURRENT MEDICATIONS: No current outpatient medications on file. (Ophthalmic Drugs)   No current facility-administered medications for this visit. (Ophthalmic Drugs)   Current Outpatient Medications (Other)  Medication Sig   Accu-Chek FastClix Lancets MISC    acetaminophen  (TYLENOL ) 500 MG tablet Take 1,000-1,500 mg by mouth every 6 (six) hours as needed for headache or mild pain.   Blood Glucose Monitoring Suppl (ACCU-CHEK GUIDE ME) w/Device KIT USE UP TO FOUR TIMES DAILY AS DIRECTED. USE TO CHECK FASTING SUGAR AND 1 TIME IN THE PM.   glucose blood (ACCU-CHEK GUIDE TEST) test strip Use as instructed   ibuprofen  (ADVIL ) 200 MG tablet Take 400-600 mg by mouth every 6 (six) hours as needed for headache or mild pain.   insulin  aspart (NOVOLOG  FLEXPEN) 100 UNIT/ML FlexPen Sliding scale. Inject three times daily with meals   insulin  glargine (LANTUS  SOLOSTAR) 100 UNIT/ML Solostar Pen INJECT 34 UNITS INTO THE SKIN AT BEDTIME.   Insulin  Pen Needle (BD PEN NEEDLE NANO U/F) 32G X 4 MM MISC USE TO INJECT INSULIN  VIA INSULIN  PEN 7 TIME A DAY   Insulin  Syringe-Needle U-100 31G X 5/16 1 ML MISC  Use with insulin  vial   No current facility-administered medications for this visit. (Other)   REVIEW OF SYSTEMS:   ALLERGIES No Known Allergies PAST MEDICAL HISTORY Past Medical History:  Diagnosis Date   Abrasion of arm, right 08/22/2011   Bipolar 1 disorder (HCC)    Depression    Diabetes mellitus    Type I - insulin  pump   Nasal fracture 08/13/2011   was hit in nose while playing basketball   Seizures (HCC)    x 3 - due to low blood sugar - last time 5-6 yrs. ago   Past Surgical History:  Procedure Laterality Date   APPENDECTOMY     CLOSED REDUCTION NASAL FRACTURE  08/24/2011   Procedure: CLOSED REDUCTION NASAL FRACTURE;  Surgeon: Alm Bouche, MD;  Location: [facility];  Service: ENT;  Laterality: N/A;   FAMILY HISTORY Family History  Problem Relation Age of Onset   Heart disease Mother        MI at age 78   Diabetes Paternal Grandfather        type 2   Hypertension Paternal Grandfather    Heart disease Paternal Grandfather    Hepatitis C Brother        older brother (incarcerated)   Hypertension Father    SOCIAL HISTORY Social History   Tobacco Use   Smoking status: Every Day    Current packs/day: 2.00    Average packs/day: 2.0 packs/day  for 7.9 years (15.8 ttl pk-yrs)    Types: Cigarettes    Start date: 2018    Passive exposure: Current   Smokeless tobacco: Never   Tobacco comments:    Currently smoking 1ppd as of 11/15/22 ep  Vaping Use   Vaping status: Never Used  Substance Use Topics   Alcohol use: Yes   Drug use: Yes    Types: Marijuana       OPHTHALMIC EXAM:  Not recorded    IMAGING AND PROCEDURES  Imaging and Procedures for 03/27/2024         ASSESSMENT/PLAN:   ICD-10-CM   1. Neovascular glaucoma of left eye, moderate stage  H40.52X2     2. 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 Current use of insulin  (HCC)  Z79.4     5. Essential hypertension  I10     6. Hypertensive retinopathy of both eyes  H35.033      1.  Neovascular Glaucoma (NVG) OS -- secondary to BRAO OS (see below)  **Pt lost to f/u from 07.17.24 to 10.29.25 (1.3 years)** - pt presented to Urgent Care yesterday for progressive eye pain OS -- referred to Dr. Fleeta, who then referred back here for NVG OS  - pt was started on samples of Vyzulta, Rhopressa and Simbrinza by Dr. Fleeta  - Today, IOP OS 32  - exam shows florid NVI and closed angles (NVA)  - s/p IVA OS #1 (10.29.25)  - recommend IVA OS #2 w/ AC tap today, 11.26.25 for NVG (see below) - RBA of procedure discussed, questions answered - informed consent obtained and signed - see procedure note   - cont Vyzulta, Rhopressa and Simbrinza per Dr. Fleeta  - Begin Atropine BID OS-sample given in office  - f/u here in 4 wks -- follow up w/ Dr Fleeta on Friday as scheduled  2. BRAO (branch retinal artery occlusion) OS - **Pt lost to f/u 3-4weeks to 1.3 years; 07. 17.24 to 10.29.25**-referred back over from Dr. Fleeta for decrease vision OS. - pt reported superior visual field loss OS with onset Thursday, 07.11.24 - initial exam (2024) showed severe arteriolar attenuation and segment of retinal whitening superior macula - FA 07.17.24 shows vascular nonperfusion superior and temporal macula  - From July 2024 not completed by pt: -Recommend the following bloodwork:  complete blood count (CBC) with differential  prothrombin time/activated partial thromboplastin time (PT/PTT) consider lipid profile, antinuclear antibody (ANA), rheumatoid factor, syphilis testing (RPR or VDRL and FTA-ABS or treponemal-specific assay), serum protein electrophoresis, hemoglobin electrophoresis, and further evaluation for hypercoagulable state: antiphospholipid syndrome, factor V Leiden, activated protein C resistance, hyperhomocysteinemia, protein  C and S deficiency, antithrombin III mutation, prothrombin mutation, etc. - Carotid artery evaluation by duplex Doppler US . - Cardiac evaluation with electrocardiography (ECG), echocardiography, and possibly Holter monitoring. - consider MRI brain for screening of subclinical emboli to brain - will discuss case with PCP to try to coordinate appropriate work-up =========================== - presents today with NVG (see above) - s/p IVA OS #1 (10.29.25) - OCT shows Focal inner retinal atrophy ST fovea and macula, old IRA IT macula seen best on widefield -- old BRAO - Recommend IVA OS #2 today, 11.26.25 w/ f/u in 4 weeks for NVG - pt wishes to proceed w/ injection - RBA of procedure discussed, questions  answered - informed consent obtained and signed on 10.29.25 - see procedure note  - f/u in 4 weeks, DFE, OCT  3,4. 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   - The need for close monitoring of blood glucose, blood pressure, and serum lipids, avoiding cigarette or any type of tobacco, and the need for long term follow up was also discussed with patient. - exam shows scattered MA/DBH OU; no NV  - FA (07.17.24) shows no NV, clusters of leaking MA posteriorly about the disc - OCT without diabetic macular edema OU - f/u as above  5,6. Hypertensive retinopathy OU  - BP in 2024: RA: 146/95 LA: 147/101 - discussed importance of tight BP control and likely contribution to BRAO OS - monitor  Ophthalmic Meds Ordered this visit:  No orders of the defined types were placed in this encounter.    No follow-ups on file.  There are no Patient Instructions on file for this visit.  Explained the diagnoses, plan, and follow up with the patient and they expressed understanding.  Patient expressed  understanding of the importance of proper follow up care.    This document serves as a record of services personally performed by Redell JUDITHANN Hans, MD, PhD. It was created on their behalf by Almetta Pesa, an ophthalmic technician. The creation of this record is the provider's dictation and/or activities during the visit.    Electronically signed by: Almetta Pesa, OA, 03/27/24  7:11 AM  This document serves as a record of services personally performed by Redell JUDITHANN Hans, MD, PhD. It was created on their behalf by Wanda GEANNIE Keens, COT an ophthalmic technician. The creation of this record is the provider's dictation and/or activities during the visit.    Electronically signed by:  Wanda GEANNIE Keens, COT  03/27/24 7:11 AM  Redell JUDITHANN Hans, M.D., Ph.D. Diseases & Surgery of the Retina and Vitreous [facility] 03/27/2024   Abbreviations: M myopia (nearsighted); A astigmatism; H hyperopia (farsighted); P presbyopia; Mrx spectacle prescription;  CTL contact lenses; OD right eye; OS left eye; OU both eyes  XT exotropia; ET esotropia; PEK punctate epithelial keratitis; PEE punctate epithelial erosions; DES dry eye syndrome; MGD meibomian gland dysfunction; ATs artificial tears; PFAT's preservative free artificial tears; NSC nuclear sclerotic cataract; PSC posterior subcapsular cataract; ERM epi-retinal membrane; PVD posterior vitreous detachment; RD retinal detachment; DM diabetes mellitus; DR diabetic retinopathy; NPDR non-proliferative diabetic retinopathy; PDR proliferative diabetic retinopathy; CSME clinically significant macular edema; DME diabetic macular edema; dbh dot blot hemorrhages; CWS cotton wool spot; POAG primary open angle glaucoma; C/D cup-to-disc ratio; HVF humphrey visual field; GVF goldmann visual field; OCT optical coherence tomography; IOP intraocular pressure; BRVO Branch retinal vein occlusion; CRVO central retinal vein occlusion; CRAO central  retinal artery occlusion; BRAO branch retinal artery occlusion; RT retinal tear; SB scleral buckle; PPV pars plana vitrectomy; VH Vitreous hemorrhage; PRP panretinal laser photocoagulation; IVK intravitreal kenalog; VMT vitreomacular traction; MH Macular hole;  NVD neovascularization of the disc; NVE neovascularization elsewhere; AREDS age related eye disease study; ARMD age related macular degeneration; POAG primary open angle glaucoma; EBMD epithelial/anterior basement membrane dystrophy; ACIOL anterior chamber intraocular lens; IOL intraocular lens; PCIOL posterior chamber intraocular lens; Phaco/IOL phacoemulsification with intraocular lens placement; PRK photorefractive keratectomy; LASIK laser assisted in situ keratomileusis; HTN hypertension; DM diabetes mellitus;  COPD chronic obstructive pulmonary disease

## 2024-03-27 ENCOUNTER — Encounter (INDEPENDENT_AMBULATORY_CARE_PROVIDER_SITE_OTHER): Admitting: Ophthalmology

## 2024-03-27 DIAGNOSIS — H4052X2 Glaucoma secondary to other eye disorders, left eye, moderate stage: Secondary | ICD-10-CM

## 2024-03-27 DIAGNOSIS — I10 Essential (primary) hypertension: Secondary | ICD-10-CM

## 2024-03-27 DIAGNOSIS — Z794 Long term (current) use of insulin: Secondary | ICD-10-CM

## 2024-03-27 DIAGNOSIS — H35033 Hypertensive retinopathy, bilateral: Secondary | ICD-10-CM

## 2024-03-27 DIAGNOSIS — E113391 Type 2 diabetes mellitus with moderate nonproliferative diabetic retinopathy without macular edema, right eye: Secondary | ICD-10-CM

## 2024-03-27 DIAGNOSIS — H34232 Retinal artery branch occlusion, left eye: Secondary | ICD-10-CM

## 2024-04-29 ENCOUNTER — Emergency Department (HOSPITAL_BASED_OUTPATIENT_CLINIC_OR_DEPARTMENT_OTHER)

## 2024-04-29 ENCOUNTER — Emergency Department (HOSPITAL_BASED_OUTPATIENT_CLINIC_OR_DEPARTMENT_OTHER)
Admission: EM | Admit: 2024-04-29 | Discharge: 2024-04-29 | Disposition: A | Discharge status: Home / Self Care | Attending: Emergency Medicine | Admitting: Emergency Medicine

## 2024-04-29 ENCOUNTER — Encounter (HOSPITAL_BASED_OUTPATIENT_CLINIC_OR_DEPARTMENT_OTHER): Payer: Self-pay

## 2024-04-29 ENCOUNTER — Other Ambulatory Visit: Payer: Self-pay

## 2024-04-29 DIAGNOSIS — E109 Type 1 diabetes mellitus without complications: Secondary | ICD-10-CM | POA: Diagnosis not present

## 2024-04-29 DIAGNOSIS — S93602A Unspecified sprain of left foot, initial encounter: Secondary | ICD-10-CM | POA: Insufficient documentation

## 2024-04-29 DIAGNOSIS — M7989 Other specified soft tissue disorders: Secondary | ICD-10-CM | POA: Diagnosis not present

## 2024-04-29 DIAGNOSIS — Z794 Long term (current) use of insulin: Secondary | ICD-10-CM | POA: Diagnosis not present

## 2024-04-29 DIAGNOSIS — S99922A Unspecified injury of left foot, initial encounter: Secondary | ICD-10-CM | POA: Diagnosis present

## 2024-04-29 DIAGNOSIS — X501XXA Overexertion from prolonged static or awkward postures, initial encounter: Secondary | ICD-10-CM | POA: Diagnosis not present

## 2024-04-29 DIAGNOSIS — Y9389 Activity, other specified: Secondary | ICD-10-CM | POA: Insufficient documentation

## 2024-04-29 NOTE — Discharge Instructions (Addendum)
 You were seen in the ER today for evaluation of your symptoms. Your XR was unremarkable. I think you likely have a sprain/strain to your foot.  Placed in a postop shoe.  You can continue to use your crutches.  I have included information for sports medicine provider for you to follow-up with.  Please call to schedule. I have also put information on the RICE method and included.  For pain, recommend taking an 1000 mg of Tylenol  and/or 60 mg of ibuprofen  every 6 hours as needed.  If you have any concerns, new or worsening symptoms, please return to your nearest emergency department for evaluation.  Contact a health care provider if: Medicine does not help your pain. Your bruising or swelling gets worse or does not get better with treatment. Your splint, boot, or cast is damaged. Get help right away if: You develop severe numbness or tingling in your foot. Your foot turns blue, white, or gray, and it feels cold.

## 2024-04-29 NOTE — ED Triage Notes (Signed)
 Reports pain in L foot since this morning. No known injury. Swelling and redness noted to toes.

## 2024-04-29 NOTE — ED Provider Notes (Signed)
 " [place] EMERGENCY DEPARTMENT AT [facility] [place] Provider Note   CSN: [id number] Arrival date & time: 04/29/24  1313     Patient presents with: Foot Pain   Rodney [name] is a 23 y.o. male with h/o T1D presents to the ER today for evaluation of left foot pain since this AM. The patient reports that he was running around playing with his kids yesterday. He woke up with pain this morning and had pain with ambulation. He does not remember any inciting incident yesterday for his symptoms. He denies any numbness, tingling, or discoloration. Reports that he feels that his foot is a little swollen. Denies any lower leg swelling. Denies any fever. He reports that he was inside all day, was not walking around outside barefoot. Denies any wounds. No medications taken prior. He couldn't remember what foot her broke previously but was concerned he could have re-injured this. His pain is to the lateral aspect    Foot Pain       Prior to Admission medications  Medication Sig Start Date End Date Taking? Authorizing Provider  Accu-Chek FastClix Lancets MISC  06/25/15   [provider]  acetaminophen  (TYLENOL ) 500 MG tablet Take 1,000-1,500 mg by mouth every 6 (six) hours as needed for headache or mild pain.    [provider]  Blood Glucose Monitoring Suppl (ACCU-CHEK GUIDE ME) w/Device KIT USE UP TO FOUR TIMES DAILY AS DIRECTED. USE TO CHECK FASTING SUGAR AND 1 TIME IN THE PM. 10/13/21   [name], [name], MD  glucose blood (ACCU-CHEK GUIDE TEST) test strip Use as instructed 10/31/23   [name], [name], MD  ibuprofen  (ADVIL ) 200 MG tablet Take 400-600 mg by mouth every 6 (six) hours as needed for headache or mild pain.    [provider]  insulin  aspart (NOVOLOG  FLEXPEN) 100 UNIT/ML FlexPen Sliding scale. Inject three times daily with meals 05/23/22   [name], [name], MD  insulin  glargine (LANTUS  SOLOSTAR) 100 UNIT/ML Solostar Pen INJECT 34 UNITS INTO THE SKIN AT  BEDTIME. 01/29/24   [name], [name], MD  Insulin  Pen Needle (BD PEN NEEDLE NANO U/F) 32G X 4 MM MISC USE TO INJECT INSULIN  VIA INSULIN  PEN 7 TIME A DAY 06/15/23   [name], [name], MD  Insulin  Syringe-Needle U-100 31G X 5/16 1 ML MISC Use with insulin  vial 08/07/20   Brennan, Michael J, MD    Allergies: Patient has no known allergies.    Review of Systems  Constitutional:  Negative for chills and fever.  Cardiovascular:  Negative for leg swelling.  Musculoskeletal:  Positive for arthralgias.  Neurological:  Negative for numbness.    Updated Vital Signs BP (!) 142/99 (BP Location: Right Arm)   Pulse 86   Temp 97.8 F (36.6 C)   Resp 16   SpO2 97%   Physical Exam Vitals and nursing note reviewed.  Constitutional:      General: He is not in acute distress.    Appearance: He is not ill-appearing or toxic-appearing.  Eyes:     General: No scleral icterus. Pulmonary:     Effort: Pulmonary effort is normal. No respiratory distress.  Musculoskeletal:        General: Tenderness present.     Right lower leg: No edema.     Left lower leg: No edema.       Feet:  Feet:     Comments: Tenderness to the plantar aspect of the left foot.  Some mild tenderness to the lateral aspect but mainly present to the plantar aspect.  No tenderness to the heel, Achilles tendon, or the medial or lateral malleoli.  No tenderness of the ball of the foot or into the toes.  Able to wiggle toes without difficulty.  Brisk cap refill present in all toes.  Palpable DP and PT pulses.  Coloration, temperature all incised.  Feel symmetric.  Patient may have some possible swelling noted to the dorsum of the foot mainly near the toes but no crepitus, increase in warmth, erythema or any induration or fluctuance.  No tenderness to where the perceived swelling is either.  There is no wounds or skin breaks or abrasions present to the foot, ankle, or any even in between the toes.  Sensation reportedly intact and symmetric  per patient.  Compartments are soft throughout.  He has pain with plantarflexion but minimal pain with dorsiflexion. Skin:    General: Skin is warm and dry.  Neurological:     Mental Status: He is alert.     (all labs ordered are listed, but only abnormal results are displayed) Labs Reviewed - No data to display  EKG: None  Radiology: DG Foot Complete Left Result Date: 04/29/2024 CLINICAL DATA:  Left foot pain since this morning.  No known injury. EXAM: LEFT FOOT - COMPLETE 3+ VIEW COMPARISON:  None Available. FINDINGS: There is no evidence of fracture or dislocation. There is no evidence of arthropathy or other focal bone abnormality. Soft tissues are unremarkable. No soft tissue gas or radiopaque foreign body. IMPRESSION: Negative radiographs of the left foot. Electronically Signed   By: Andrea Gasman M.D.   On: 04/29/2024 14:28   Procedures   Medications Ordered in the ED - No data to display                              Medical Decision Making Amount and/or Complexity of Data Reviewed Radiology: ordered.   23 y.o. male presents to the ER for evaluation of left foot pain. Differential diagnosis includes but is not limited to sprain, strain, soft tissue injury, fracture, cellulitis. Vital signs mildly elevated BP otherwise unremarkable. Physical exam as noted above.   XR shows negative radiographs of the left foot. Per radiologist's interpretation.    I reviewed nursing note, I do not appreciate any color change noted to the bilateral feet.  Coloration, temperature, and size appear and feel symmetric bilaterally.  He has no obvious deformity.  No known trauma however patient was doing a lot of roughhousing and on his feet yesterday.  Also ports he did have a new pair of shoes on.  Could be muscular sprain/strain from this.  I am not concerned for any infection or cellulitis given presentation.  Could be just having some tendinopathy as well.  He is neurovasc intact distally.   Will provide postop shoe, patient came in with crutches and will give sports medicine follow-up as well.  RICE method discussed with him as well.  Stable for discharge home.  We discussed the results of the labs/imaging. The plan is RICE, follow up with sports medicine. We discussed strict return precautions and red flag symptoms. The patient verbalized their understanding and agrees to the plan. The patient is stable and being discharged home in good condition.  Portions of this report may have been transcribed using voice recognition software. Every effort was made to ensure accuracy; however, inadvertent computerized transcription  errors may be present.    Final diagnoses:  Foot sprain, left, initial encounter    ED Discharge Orders     None          Bernis Ernst, [place] 04/29/24 1758  "

## 2024-06-06 NOTE — Progress Notes (Shared)
 " [facility] - Clinic Note  06/12/2024   CHIEF COMPLAINT Patient presents for No chief complaint on file.  HISTORY OF PRESENT ILLNESS: Rodney [name] is a 24 y.o. male who presents to the clinic today for:   Pt states he presented to urgent care for eye pain in OS, was sent to Dr. Fleeta who was on call. Pt states after his appt w/ us  in July of last year and just didn't make an appointment. Does not have f/u scheduled w/ Dr. Fleeta, did not bring drops. Currently on Rhopressa, Vyzulta, Simbrinza -- started by Dr. Fleeta yesterday  Referring physician: [name], [name], MD [address] [place],  [place] [address]  HISTORICAL INFORMATION:  Selected notes from the MEDICAL RECORD NUMBER Referred by Dr. Lamar Gaudy for subacute vision loss OS, inferior visual field LEE:  Ocular Hx- PMH-   CURRENT MEDICATIONS: No current outpatient medications on file. (Ophthalmic Drugs)   No current facility-administered medications for this visit. (Ophthalmic Drugs)   Current Outpatient Medications (Other)  Medication Sig   Accu-Chek FastClix Lancets MISC    acetaminophen  (TYLENOL ) 500 MG tablet Take 1,000-1,500 mg by mouth every 6 (six) hours as needed for headache or mild pain.   Blood Glucose Monitoring Suppl (ACCU-CHEK GUIDE ME) w/Device KIT USE UP TO FOUR TIMES DAILY AS DIRECTED. USE TO CHECK FASTING SUGAR AND 1 TIME IN THE PM.   glucose blood (ACCU-CHEK GUIDE TEST) test strip Use as instructed   ibuprofen  (ADVIL ) 200 MG tablet Take 400-600 mg by mouth every 6 (six) hours as needed for headache or mild pain.   insulin  aspart (NOVOLOG  FLEXPEN) 100 UNIT/ML FlexPen Sliding scale. Inject three times daily with meals   insulin  glargine (LANTUS  SOLOSTAR) 100 UNIT/ML Solostar Pen INJECT 34 UNITS INTO THE SKIN AT BEDTIME.   Insulin  Pen Needle (BD PEN NEEDLE NANO U/F) 32G X 4 MM MISC USE TO INJECT INSULIN  VIA INSULIN  PEN 7 TIME A DAY   Insulin  Syringe-Needle U-100 31G X 5/16 1 ML MISC  Use with insulin  vial   No current facility-administered medications for this visit. (Other)   REVIEW OF SYSTEMS:   ALLERGIES No Known Allergies PAST MEDICAL HISTORY Past Medical History:  Diagnosis Date   Abrasion of arm, right 08/22/2011   Bipolar 1 disorder (HCC)    Depression    Diabetes mellitus    Type I - insulin  pump   Nasal fracture 08/13/2011   was hit in nose while playing basketball   Seizures (HCC)    x 3 - due to low blood sugar - last time 5-6 yrs. ago   Past Surgical History:  Procedure Laterality Date   APPENDECTOMY     CLOSED REDUCTION NASAL FRACTURE  08/24/2011   Procedure: CLOSED REDUCTION NASAL FRACTURE;  Surgeon: Alm Bouche, MD;  Location: [facility];  Service: ENT;  Laterality: N/A;   FAMILY HISTORY Family History  Problem Relation Age of Onset   Heart disease Mother        MI at age 15   Diabetes Paternal Grandfather        type 2   Hypertension Paternal Grandfather    Heart disease Paternal Grandfather    Hepatitis C Brother        older brother (incarcerated)   Hypertension Father    SOCIAL HISTORY Social History   Tobacco Use   Smoking status: Every Day    Current packs/day: 2.00    Average packs/day: 2.0  packs/day for 8.1 years (16.2 ttl pk-yrs)    Types: Cigarettes    Start date: 2018    Passive exposure: Current   Smokeless tobacco: Never   Tobacco comments:    Currently smoking 1ppd as of 11/15/22 ep  Vaping Use   Vaping status: Never Used  Substance Use Topics   Alcohol use: Yes   Drug use: Yes    Types: Marijuana       OPHTHALMIC EXAM:  Not recorded    IMAGING AND PROCEDURES  Imaging and Procedures for 06/12/2024         ASSESSMENT/PLAN: No diagnosis found.  1.  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 Fleeta, who then  referred back here for NVG OS  - s/p IVA OS #1 (10.29.25)  - pt was started on samples of Vyzulta, Rhopressa and Simbrinza by Dr. Fleeta  - Today, IOP OS 32  - exam shows florid NVI and closed angles (NVA)  - recommend IVA OS #2 w/ AC tap today, 2.11.26 for NVG - RBA of procedure discussed, questions answered - informed consent obtained and signed - see procedure note   - cont Vyzulta, Rhopressa and Simbrinza per Dr. Fleeta  - Begin Atropine BID OS-sample given in office  - f/u here in 4 wks -- follow up w/ Dr Fleeta on Friday as scheduled  2. BRAO (branch retinal artery occlusion) OS  - **Pt lost to f/u 3-4weeks to 1.3 years; 07. 17.24 to 10.29.25**-referred back over from Dr. Fleeta for decrease vision OS. - pt reported superior visual field loss OS with onset Thursday, 07.11.24 - initial exam (2024) showed severe arteriolar attenuation and segment of retinal whitening superior macula - FA 07.17.24 shows vascular nonperfusion superior and temporal macula  - From July 2024 not completed by pt: -Recommend the following bloodwork:  complete blood count (CBC) with differential  prothrombin time/activated partial thromboplastin time (PT/PTT) consider lipid profile, antinuclear antibody (ANA), rheumatoid factor, syphilis testing (RPR or VDRL and FTA-ABS or treponemal-specific assay), serum protein electrophoresis, hemoglobin electrophoresis, and further evaluation for hypercoagulable state: antiphospholipid syndrome, factor V Leiden, activated protein C resistance, hyperhomocysteinemia, protein C and S deficiency, antithrombin III mutation, prothrombin mutation, etc. - Carotid artery evaluation by duplex Doppler US . - Cardiac evaluation with electrocardiography (ECG), echocardiography, and possibly Holter monitoring. - consider MRI brain for screening of subclinical emboli to brain - will discuss case with PCP to try to coordinate appropriate work-up =========================== - presents today with NVG  (see above) - OCT shows Focal inner retinal atrophy ST fovea and macula, old IRA IT macula seen best on widefield -- old BRAO - Recommend IVA OS #1 today, 10.29.25 w/ f/u in 4 weeks for NVG - pt wishes to proceed w/ injection - RBA of procedure discussed, questions answered - informed consent obtained and signed on 10.29.25 - see procedure note  - f/u in 4 weeks, DFE, OCT  3,4. 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   - The need for close monitoring of blood glucose, blood pressure, and serum lipids, avoiding cigarette or any type of tobacco, and the need for long term follow up was also discussed  with patient. - exam shows scattered MA/DBH OU; no NV  - FA (07.17.24) shows no NV, clusters of leaking MA posteriorly about the disc - OCT without diabetic macular edema OU - f/u as above  5,6. Hypertensive retinopathy OU  - BP in 2024: RA: 146/95 LA: 147/101 - discussed importance of tight BP control and likely contribution to BRAO OS - monitor  Ophthalmic Meds Ordered this visit:  No orders of the defined types were placed in this encounter.    No follow-ups on file.  There are no Patient Instructions on file for this visit.  Explained the diagnoses, plan, and follow up with the patient and they expressed understanding.  Patient expressed understanding of the importance of proper follow up care.    This document serves as a record of services personally performed by Redell JUDITHANN Hans, MD, PhD. It was created on their behalf by Almetta Pesa, an ophthalmic technician. The creation of this record is the provider's dictation and/or activities during the visit.    Electronically signed by: Almetta Pesa, OA, 06/06/24  1:26 PM  Redell JUDITHANN Hans, M.D., Ph.D. Diseases & Surgery of the  Retina and Vitreous [facility] 06/12/2024   Abbreviations: M myopia (nearsighted); A astigmatism; H hyperopia (farsighted); P presbyopia; Mrx spectacle prescription;  CTL contact lenses; OD right eye; OS left eye; OU both eyes  XT exotropia; ET esotropia; PEK punctate epithelial keratitis; PEE punctate epithelial erosions; DES dry eye syndrome; MGD meibomian gland dysfunction; ATs artificial tears; PFAT's preservative free artificial tears; NSC nuclear sclerotic cataract; PSC posterior subcapsular cataract; ERM epi-retinal membrane; PVD posterior vitreous detachment; RD retinal detachment; DM diabetes mellitus; DR diabetic retinopathy; NPDR non-proliferative diabetic retinopathy; PDR proliferative diabetic retinopathy; CSME clinically significant macular edema; DME diabetic macular edema; dbh dot blot hemorrhages; CWS cotton wool spot; POAG primary open angle glaucoma; C/D cup-to-disc ratio; HVF humphrey visual field; GVF goldmann visual field; OCT optical coherence tomography; IOP intraocular pressure; BRVO Branch retinal vein occlusion; CRVO central retinal vein occlusion; CRAO central retinal artery occlusion; BRAO branch retinal artery occlusion; RT retinal tear; SB scleral buckle; PPV pars plana vitrectomy; VH Vitreous hemorrhage; PRP panretinal laser photocoagulation; IVK intravitreal kenalog; VMT vitreomacular traction; MH Macular hole;  NVD neovascularization of the disc; NVE neovascularization elsewhere; AREDS age related eye disease study; ARMD age related macular degeneration; POAG primary open angle glaucoma; EBMD epithelial/anterior basement membrane dystrophy; ACIOL anterior chamber intraocular lens; IOL intraocular lens; PCIOL posterior chamber intraocular lens; Phaco/IOL phacoemulsification with intraocular lens placement; PRK photorefractive keratectomy; LASIK laser assisted in situ keratomileusis; HTN hypertension; DM diabetes mellitus; COPD chronic obstructive  pulmonary disease  "

## 2024-06-12 ENCOUNTER — Encounter (INDEPENDENT_AMBULATORY_CARE_PROVIDER_SITE_OTHER): Admitting: Ophthalmology

## 2024-06-12 DIAGNOSIS — H35033 Hypertensive retinopathy, bilateral: Secondary | ICD-10-CM

## 2024-06-12 DIAGNOSIS — Z794 Long term (current) use of insulin: Secondary | ICD-10-CM

## 2024-06-12 DIAGNOSIS — I10 Essential (primary) hypertension: Secondary | ICD-10-CM

## 2024-06-12 DIAGNOSIS — E113391 Type 2 diabetes mellitus with moderate nonproliferative diabetic retinopathy without macular edema, right eye: Secondary | ICD-10-CM

## 2024-06-12 DIAGNOSIS — H4052X2 Glaucoma secondary to other eye disorders, left eye, moderate stage: Secondary | ICD-10-CM

## 2024-06-12 DIAGNOSIS — H34232 Retinal artery branch occlusion, left eye: Secondary | ICD-10-CM
